# Patient Record
Sex: Female | Born: 1975 | Hispanic: Yes | Marital: Married | Smoking: Never smoker
Health system: Southern US, Community
[De-identification: ages and names within clinical notes are randomized; demographics above are authoritative.]

## PROBLEM LIST (undated history)

## (undated) DIAGNOSIS — E119 Type 2 diabetes mellitus without complications: Secondary | ICD-10-CM

## (undated) DIAGNOSIS — E785 Hyperlipidemia, unspecified: Secondary | ICD-10-CM

## (undated) DIAGNOSIS — I10 Essential (primary) hypertension: Secondary | ICD-10-CM

## (undated) HISTORY — PX: TUBAL LIGATION: SHX77

## (undated) HISTORY — DX: Type 2 diabetes mellitus without complications: E11.9

## (undated) HISTORY — DX: Hyperlipidemia, unspecified: E78.5

## (undated) HISTORY — DX: Essential (primary) hypertension: I10

---

## 2011-01-11 ENCOUNTER — Emergency Department (HOSPITAL_COMMUNITY)
Admission: EM | Admit: 2011-01-11 | Discharge: 2011-01-11 | Disposition: A | Discharge status: Home / Self Care | Payer: Self-pay | Attending: Emergency Medicine | Admitting: Emergency Medicine

## 2011-01-11 ENCOUNTER — Emergency Department (HOSPITAL_COMMUNITY): Payer: Self-pay

## 2011-01-11 DIAGNOSIS — R0602 Shortness of breath: Secondary | ICD-10-CM | POA: Insufficient documentation

## 2011-01-11 DIAGNOSIS — E119 Type 2 diabetes mellitus without complications: Secondary | ICD-10-CM | POA: Insufficient documentation

## 2011-01-11 LAB — CBC
Hemoglobin: 11 g/dL — ABNORMAL LOW (ref 12.0–15.0)
MCH: 25.5 pg — ABNORMAL LOW (ref 26.0–34.0)
MCHC: 33.6 g/dL (ref 30.0–36.0)
MCV: 75.9 fL — ABNORMAL LOW (ref 78.0–100.0)
Platelets: 327 10*3/uL (ref 150–400)
RBC: 4.31 MIL/uL (ref 3.87–5.11)

## 2011-01-11 LAB — URINALYSIS, ROUTINE W REFLEX MICROSCOPIC
Nitrite: NEGATIVE
Protein, ur: NEGATIVE mg/dL
Specific Gravity, Urine: 1.012 (ref 1.005–1.030)
Urobilinogen, UA: 0.2 mg/dL (ref 0.0–1.0)

## 2011-01-11 LAB — URINE MICROSCOPIC-ADD ON

## 2011-01-11 LAB — POCT I-STAT TROPONIN I

## 2011-01-11 LAB — POCT I-STAT, CHEM 8
BUN: 9 mg/dL (ref 6–23)
Calcium, Ion: 1.26 mmol/L (ref 1.12–1.32)
Creatinine, Ser: 0.4 mg/dL — ABNORMAL LOW (ref 0.50–1.10)
Hemoglobin: 12.2 g/dL (ref 12.0–15.0)
TCO2: 21 mmol/L (ref 0–100)

## 2011-01-11 LAB — DIFFERENTIAL
Basophils Relative: 0 % (ref 0–1)
Eosinophils Absolute: 0.1 10*3/uL (ref 0.0–0.7)
Lymphs Abs: 2.6 10*3/uL (ref 0.7–4.0)
Monocytes Absolute: 0.6 10*3/uL (ref 0.1–1.0)
Monocytes Relative: 5 % (ref 3–12)
Neutrophils Relative %: 73 % (ref 43–77)

## 2011-01-11 LAB — D-DIMER, QUANTITATIVE: D-Dimer, Quant: 0.53 ug/mL-FEU — ABNORMAL HIGH (ref 0.00–0.48)

## 2011-01-11 LAB — POCT PREGNANCY, URINE: Preg Test, Ur: NEGATIVE

## 2011-01-11 MED ORDER — IOHEXOL 300 MG/ML  SOLN
80.0000 mL | Freq: Once | INTRAMUSCULAR | Status: AC | PRN
  Administered 2011-01-11: 80 mL via INTRAVENOUS

## 2013-04-07 ENCOUNTER — Other Ambulatory Visit: Payer: Self-pay

## 2013-04-07 ENCOUNTER — Ambulatory Visit: Payer: Self-pay | Admitting: Family Medicine

## 2013-04-07 ENCOUNTER — Encounter (HOSPITAL_COMMUNITY): Payer: Self-pay | Admitting: Emergency Medicine

## 2013-04-07 ENCOUNTER — Emergency Department (HOSPITAL_COMMUNITY)
Admission: EM | Admit: 2013-04-07 | Discharge: 2013-04-07 | Disposition: A | Discharge status: Home / Self Care | Payer: Self-pay | Attending: Emergency Medicine | Admitting: Emergency Medicine

## 2013-04-07 ENCOUNTER — Emergency Department (HOSPITAL_COMMUNITY): Payer: Self-pay

## 2013-04-07 VITALS — BP 130/80 | HR 76 | Temp 100.0°F | Resp 16 | Ht 62.0 in | Wt 167.0 lb

## 2013-04-07 DIAGNOSIS — R002 Palpitations: Secondary | ICD-10-CM

## 2013-04-07 DIAGNOSIS — R42 Dizziness and giddiness: Secondary | ICD-10-CM

## 2013-04-07 DIAGNOSIS — R0609 Other forms of dyspnea: Secondary | ICD-10-CM

## 2013-04-07 DIAGNOSIS — R5381 Other malaise: Secondary | ICD-10-CM | POA: Insufficient documentation

## 2013-04-07 DIAGNOSIS — E785 Hyperlipidemia, unspecified: Secondary | ICD-10-CM | POA: Insufficient documentation

## 2013-04-07 DIAGNOSIS — R011 Cardiac murmur, unspecified: Secondary | ICD-10-CM

## 2013-04-07 DIAGNOSIS — R0789 Other chest pain: Secondary | ICD-10-CM | POA: Insufficient documentation

## 2013-04-07 DIAGNOSIS — R059 Cough, unspecified: Secondary | ICD-10-CM | POA: Insufficient documentation

## 2013-04-07 DIAGNOSIS — I10 Essential (primary) hypertension: Secondary | ICD-10-CM | POA: Insufficient documentation

## 2013-04-07 DIAGNOSIS — R509 Fever, unspecified: Secondary | ICD-10-CM

## 2013-04-07 DIAGNOSIS — R0600 Dyspnea, unspecified: Secondary | ICD-10-CM

## 2013-04-07 DIAGNOSIS — R0602 Shortness of breath: Secondary | ICD-10-CM | POA: Insufficient documentation

## 2013-04-07 DIAGNOSIS — E119 Type 2 diabetes mellitus without complications: Secondary | ICD-10-CM

## 2013-04-07 DIAGNOSIS — R51 Headache: Secondary | ICD-10-CM | POA: Insufficient documentation

## 2013-04-07 DIAGNOSIS — Z79899 Other long term (current) drug therapy: Secondary | ICD-10-CM | POA: Insufficient documentation

## 2013-04-07 DIAGNOSIS — R05 Cough: Secondary | ICD-10-CM

## 2013-04-07 DIAGNOSIS — R079 Chest pain, unspecified: Secondary | ICD-10-CM

## 2013-04-07 LAB — POCT CBC
HCT, POC: 32.8 % — AB (ref 37.7–47.9)
Lymph, poc: 2.5 (ref 0.6–3.4)
MCH, POC: 25.6 pg — AB (ref 27–31.2)
MCHC: 31.1 g/dL — AB (ref 31.8–35.4)
MCV: 82.1 fL (ref 80–97)
MID (cbc): 0.4 (ref 0–0.9)
POC LYMPH PERCENT: 28.2 %L (ref 10–50)
RDW, POC: 14.5 %
WBC: 8.7 10*3/uL (ref 4.6–10.2)

## 2013-04-07 LAB — POCT INFLUENZA A/B: Influenza B, POC: NEGATIVE

## 2013-04-07 LAB — BASIC METABOLIC PANEL
BUN: 4 mg/dL — ABNORMAL LOW (ref 6–23)
CO2: 25 mEq/L (ref 19–32)
Chloride: 94 mEq/L — ABNORMAL LOW (ref 96–112)
Creatinine, Ser: 0.4 mg/dL — ABNORMAL LOW (ref 0.50–1.10)
Glucose, Bld: 142 mg/dL — ABNORMAL HIGH (ref 70–99)

## 2013-04-07 LAB — POCT I-STAT TROPONIN I: Troponin i, poc: 0 ng/mL (ref 0.00–0.08)

## 2013-04-07 NOTE — Progress Notes (Addendum)
This chart was scribed for Kristen Staggers, MD by Kristen Rogers, ED Scribe. This patient was seen in room Room/bed info not found and the patient's care was started at 10:01 AM. Subjective:    Patient ID: Kristen Rogers, female    DOB: [birth year], 37 y.o.   MRN: [id number] Chief Complaint  Patient presents with  . Dizziness    this am  . Headache  . Palpitations   HPI Kristen Rogers is a 37 y.o. female with hx of DM, hyperlipidemia, and hypertension who presents to the [facility] complaining of ongoing dizziness, HA, and palpitations with associated nausea and CP that began last night.Pt rates her current pain as 4/10. She states her pain was a 4/10 last night. She states her HA made her feel dizzy. She reports having difficulty breathing that began last night. Pt reports having frontal HA when palpitations began. Pt states she has been having a persistent cough for the past 8 days, minimal dry cough. Pt states her husband was sick last week and tested positive for flu. Pt denies getting the flu this season. Pt denies fever.  Review of Systems  Constitutional: Negative for fever.  Respiratory: Positive for cough.   Cardiovascular: Positive for chest pain and palpitations.  Gastrointestinal: Positive for nausea.  Neurological: Positive for dizziness and headaches.   Objective:   Physical Exam  Vitals reviewed. Constitutional: She is oriented to person, place, and time. She appears well-developed and well-nourished. No distress.  HENT:  Head: Normocephalic and atraumatic.  Right Ear: Hearing, tympanic membrane, external ear and ear canal normal.  Left Ear: Hearing, tympanic membrane, external ear and ear canal normal.  Nose: Nose normal.  Mouth/Throat: Oropharynx is clear and moist. No oropharyngeal exudate.  Eyes: Conjunctivae and EOM are normal. Pupils are equal, round, and reactive to light.  No nystagmus.  Neck:  No lymphadenopathy.  Cardiovascular: Normal rate, regular  rhythm, normal heart sounds and intact distal pulses.   Great 2-6 out of 6 systolic murmur her L sternal border  Pulmonary/Chest: Effort normal and breath sounds normal. No respiratory distress. She has no wheezes. She has no rhonchi.  Unable to reproduce pain with palpitation. Equal breath sounds bilaterally.  Abdominal: Soft. There is no tenderness.  Musculoskeletal:  Calves non-tender and no swelling. No lower extremity swelling.  Lymphadenopathy:    She has no cervical adenopathy.  Neurological: She is alert and oriented to person, place, and time.  Unsteady with Romberg but no fall. Neg pronator drift. Non-focal otherwise.  Skin: Skin is warm and dry. No rash noted.  Psychiatric: She has a normal mood and affect. Her behavior is normal.    Filed Vitals:   04/07/13 0929  BP: 130/80  Pulse: 76  Temp: 100 F (37.8 C)  TempSrc: Oral  Resp: 16  Height: 5\' 2"  (1.575 m)  Weight: 167 lb (75.751 kg)  SpO2: 98%   Results for orders placed in visit on 04/07/13  POCT CBC      Result Value Range   WBC 8.7  4.6 - 10.2 K/uL   Lymph, poc 2.5  0.6 - 3.4   POC LYMPH PERCENT 28.2  10 - 50 %L   MID (cbc) 0.4  0 - 0.9   POC MID % 5.1  0 - 12 %M   POC Granulocyte 5.8  2 - 6.9   Granulocyte percent 66.7  37 - 80 %G   RBC 3.99 (*) 4.04 - 5.48 M/uL   Hemoglobin 10.2 (*) 12.2 - 16.2  g/dL   HCT, POC 65.7 (*) 84.6 - 47.9 %   MCV 82.1  80 - 97 fL   MCH, POC 25.6 (*) 27 - 31.2 pg   MCHC 31.1 (*) 31.8 - 35.4 g/dL   RDW, POC 96.2     Platelet Count, POC 311  142 - 424 K/uL   MPV 9.7  0 - 99.8 fL  POCT INFLUENZA A/B      Result Value Range   Influenza A, POC Negative     Influenza B, POC Negative     EKG: SR, no acute findings.   10:49 AM EMS called for transport, placed in room and on monitor - sinus rhythm. 10:52 AM 20 gauge IV, L ac, NS.  Advised charge nurse at ER.   10:56 AM Transfer of care to EMS after report.   Assessment & Plan:  Kristen Rogers is a 37 y.o.  female Palpitations - Plan: EKG 12-Lead  Fever - Plan: POCT CBC, POCT Influenza A/B  Chest pain  Dizziness - Plan: POCT CBC, POCT Influenza A/B  Dyspnea  Diabetes  Heart murmur  Hx of Dm2, hyperlipidemia. Initial cough for about a week, but now with acute onset of L chest pain, palpitations, associated HA, dizziness and nausea since last night.  Flu testing and blood counts as above WNL. Will have evaluated at [facility] for chest pain as cardiac RF's of DM and hyperlipidemia, and possible new onset murmur. Charge nurse advised, sent to ER by EMS.   Patient Instructions   Further evaluation at [facility]:  Driving directions to [facility] 3D2D  [phone]  - more info    [address]  [place], [place] [address]     1. Head south on [address] toward [address]      0.5 mi    2. Sharp left onto [address]      0.6 mi    3. Turn left onto the [address] ramp      0.2 mi    4. Merge onto [address]      3.0 mi    5. Continue straight to stay on [address]      0.4 mi    6. Slight left to stay on [address] E      1.2 mi    7. Turn right onto [address]      0.1 mi    8. Turn left onto [address]      361 ft    9. Take the 1st left onto [facility]  Destination will be on the right

## 2013-04-07 NOTE — ED Notes (Signed)
MD at bedside assessing patient via interpreter phone.

## 2013-04-07 NOTE — ED Provider Notes (Signed)
CSN: [id number]     Arrival date & time 04/07/13  1141 History   First MD Initiated Contact with Patient 04/07/13 1226     Chief Complaint  Patient presents with  . Chest Pain  . Headache  . Cough   (Consider location/radiation/quality/duration/timing/severity/associated sxs/prior Treatment) HPI Ms. Kristen Rogers is a 37 y.o. spanish-speaking female w/ PMHx of HTN, HLD, DM type II, presents to the ED w/ complaints of recent cough, SOB, sharp left-sided chest pain, and headache for the past week. The patient was sent from urgent care for further workup of URI symptoms. She claims she has had recent SOB w/ an accompanying cough, not productive of sputum, as well as a sharp chest pain, worse with inspiration and mildly reproducible. No radiation, no associated nausea, vomiting, diaphoresis. She claims to have had a subjective fever at home. She also claims her husband has had URI symptoms recently as well. The patient also describes a global headache, dull in nature, not associated w/ photophobia, nausea, vomiting, change in vision, or retro-orbital pain.   Past Medical History  Diagnosis Date  . Hypertension   . Hyperlipidemia   . Diabetes mellitus without complication    History reviewed. No pertinent past surgical history. History reviewed. No pertinent family history. History  Substance Use Topics  . Smoking status: Never Smoker   . Smokeless tobacco: Not on file  . Alcohol Use: No   OB History   Grav Para Term Preterm Abortions TAB SAB Ect Mult Living                 Review of Systems General: Positive for subjective fever and fatigue. Denies diaphoresis, appetite change.  Respiratory: Positive for SOB and cough. Denies DOE, chest tightness, and wheezing.   Cardiovascular: Positive for chest pain, palpitations. Denies leg swelling.  Gastrointestinal: Denies nausea, vomiting, abdominal pain, diarrhea, constipation, blood in stool and abdominal distention.  Genitourinary:  Denies dysuria, urgency, frequency, hematuria, flank pain and difficulty urinating.  Endocrine: Denies hot or cold intolerance, sweats, polyuria, polydipsia. Musculoskeletal: Denies myalgias, back pain, joint swelling, arthralgias and gait problem.  Skin: Denies pallor, rash and wounds.  Neurological: Positive for headaches. Denies dizziness, seizures, syncope, weakness, lightheadedness, numbness.  Psychiatric/Behavioral: Denies mood changes, confusion, nervousness, sleep disturbance and agitation.  Allergies  Review of patient's allergies indicates no known allergies.  Home Medications   Current Outpatient Rx  Name  Route  Sig  Dispense  Refill  . FLUoxetine (PROZAC) 10 MG capsule   Oral   Take 10 mg by mouth at bedtime.          Marland Kitchen glimepiride (AMARYL) 2 MG tablet   Oral   Take 2 mg by mouth daily with breakfast.         . lisinopril (PRINIVIL,ZESTRIL) 5 MG tablet   Oral   Take 5 mg by mouth daily.         Marland Kitchen lovastatin (MEVACOR) 20 MG tablet   Oral   Take 20 mg by mouth at bedtime.         . metformin (FORTAMET) 1000 MG (OSM) 24 hr tablet   Oral   Take 1,000 mg by mouth daily with breakfast.         . Omega-3 Fatty Acids (FISH OIL TRIPLE STRENGTH PO)   Oral   Take 1,500 mg by mouth daily.          Physical Exam Filed Vitals:   04/07/13 1145 04/07/13 1200 04/07/13 1215 04/07/13 1230  BP: 103/70 102/65  111/70  Pulse: 70 69 69 67  Temp:      TempSrc:      Resp: 17 17 16 16   Weight:      SpO2: 100% 100% 100% 100%  General: Vital signs reviewed.  Patient is a well-developed and well-nourished, in no acute distress and cooperative with exam. Alert and oriented x3.  Head: Normocephalic and atraumatic. Eyes: PERRL, EOMI, conjunctivae normal, No scleral icterus.  Neck: Supple, trachea midline, normal ROM, No JVD, masses, thyromegaly, or carotid bruit present.  Cardiovascular: RRR, S1 normal, S2 normal, no murmurs, gallops, or rubs. Pulmonary/Chest: Normal  respiratory effort, CTAB, no wheezes, rales, or rhonchi. Abdominal: Soft, non-tender, non-distended, bowel sounds are normal, no masses, organomegaly, or guarding present.  Musculoskeletal: No joint deformities, erythema, or stiffness, ROM full and no nontender. Extremities: No swelling or edema,  pulses symmetric and intact bilaterally. No cyanosis or clubbing. Neurological: A&O x3, Strength is normal and symmetric bilaterally, cranial nerve II-XII are grossly intact, no focal motor deficit, sensory intact to light touch bilaterally.  Skin: Warm, dry and intact. No rashes or erythema. Psychiatric: Normal mood and affect. speech and behavior is normal. Cognition and memory are normal.   ED Course  Procedures (including critical care time) Labs Review Labs Reviewed  BASIC METABOLIC PANEL   Imaging Review No results found.  EKG Interpretation   None       MDM   Ms. Kristen Rogers is a 37 y.o. spanish-speaking female w/ PMHx of HTN, HLD, DM type II, presents to the ED w/ complaints of recent cough, SOB, sharp left-sided chest pain, and headache for the past week. Given clinical findings and history, most likely w/ viral URI at this time. Seen in urgent care, influenza -ve. No leukocytosis. EKG wnl. -CXR shows no edema or consolidation. -BMET w/ mild hyponatremia (130), otherwise wnl. -poc troponin -ve  Patient most likely w/ viral URI. Will advise patient to follow up with PCP/urgent care in 1-2 weeks, or if symptoms get worse. Patient okay for discharge.   Courtney Paris, MD 04/07/13 [phone]

## 2013-04-07 NOTE — ED Notes (Signed)
PT ambulated with baseline gait; VSS; A&Ox3; no signs of distress; respirations even and unlabored; skin warm and dry; no questions upon discharge.  

## 2013-04-07 NOTE — ED Provider Notes (Addendum)
I saw and evaluated the patient, reviewed the resident's note and I agree with the findings and plan.  EKG Interpretation   None     EKG in MUSE  Pt with viral illness, no concern for ACS, PE or other acute cardiopulm process.   Tyshaun Vinzant B. Bernette Mayers, MD 04/07/13 1823  Bonnita Levan. Bernette Mayers, MD 04/07/13 1610

## 2013-04-07 NOTE — ED Notes (Signed)
Pt in via EMS from urgent care, went there for evaluation for cough x2-3 days with chest pain, also headache, chest pain is worse with inspiration, negative chest xray, patient denies chest pain upon arrival to ED, c/o headache, sent here for further evaluation. IV started at urgent care.

## 2013-04-07 NOTE — Patient Instructions (Addendum)
Further evaluation at [facility]:  Driving directions to [facility] 3D2D  [phone]  - more info    [address]  [place], [place] [address]     1. Head south on [address] toward [address]      0.5 mi    2. Sharp left onto [address]      0.6 mi    3. Turn left onto the [address] ramp      0.2 mi    4. Merge onto [address]      3.0 mi    5. Continue straight to stay on [address] E      0.4 mi    6. Slight left to stay on [address] E      1.2 mi    7. Turn right onto [address]      0.1 mi    8. Turn left onto [address]      361 ft    9. Take the 1st left onto [facility]  Destination will be on the right

## 2013-04-07 NOTE — ED Notes (Signed)
Pt returned from xray

## 2014-05-11 IMAGING — CR DG CHEST 2V
2 series · 2 of 2 positions shown · non-contrast
Comparison: Chest radiograph January 11, 2011 and chest CT
January 11, 2011

CLINICAL DATA: Chest pain and cough

EXAM:
CHEST  2 VIEW

[w chest pa]
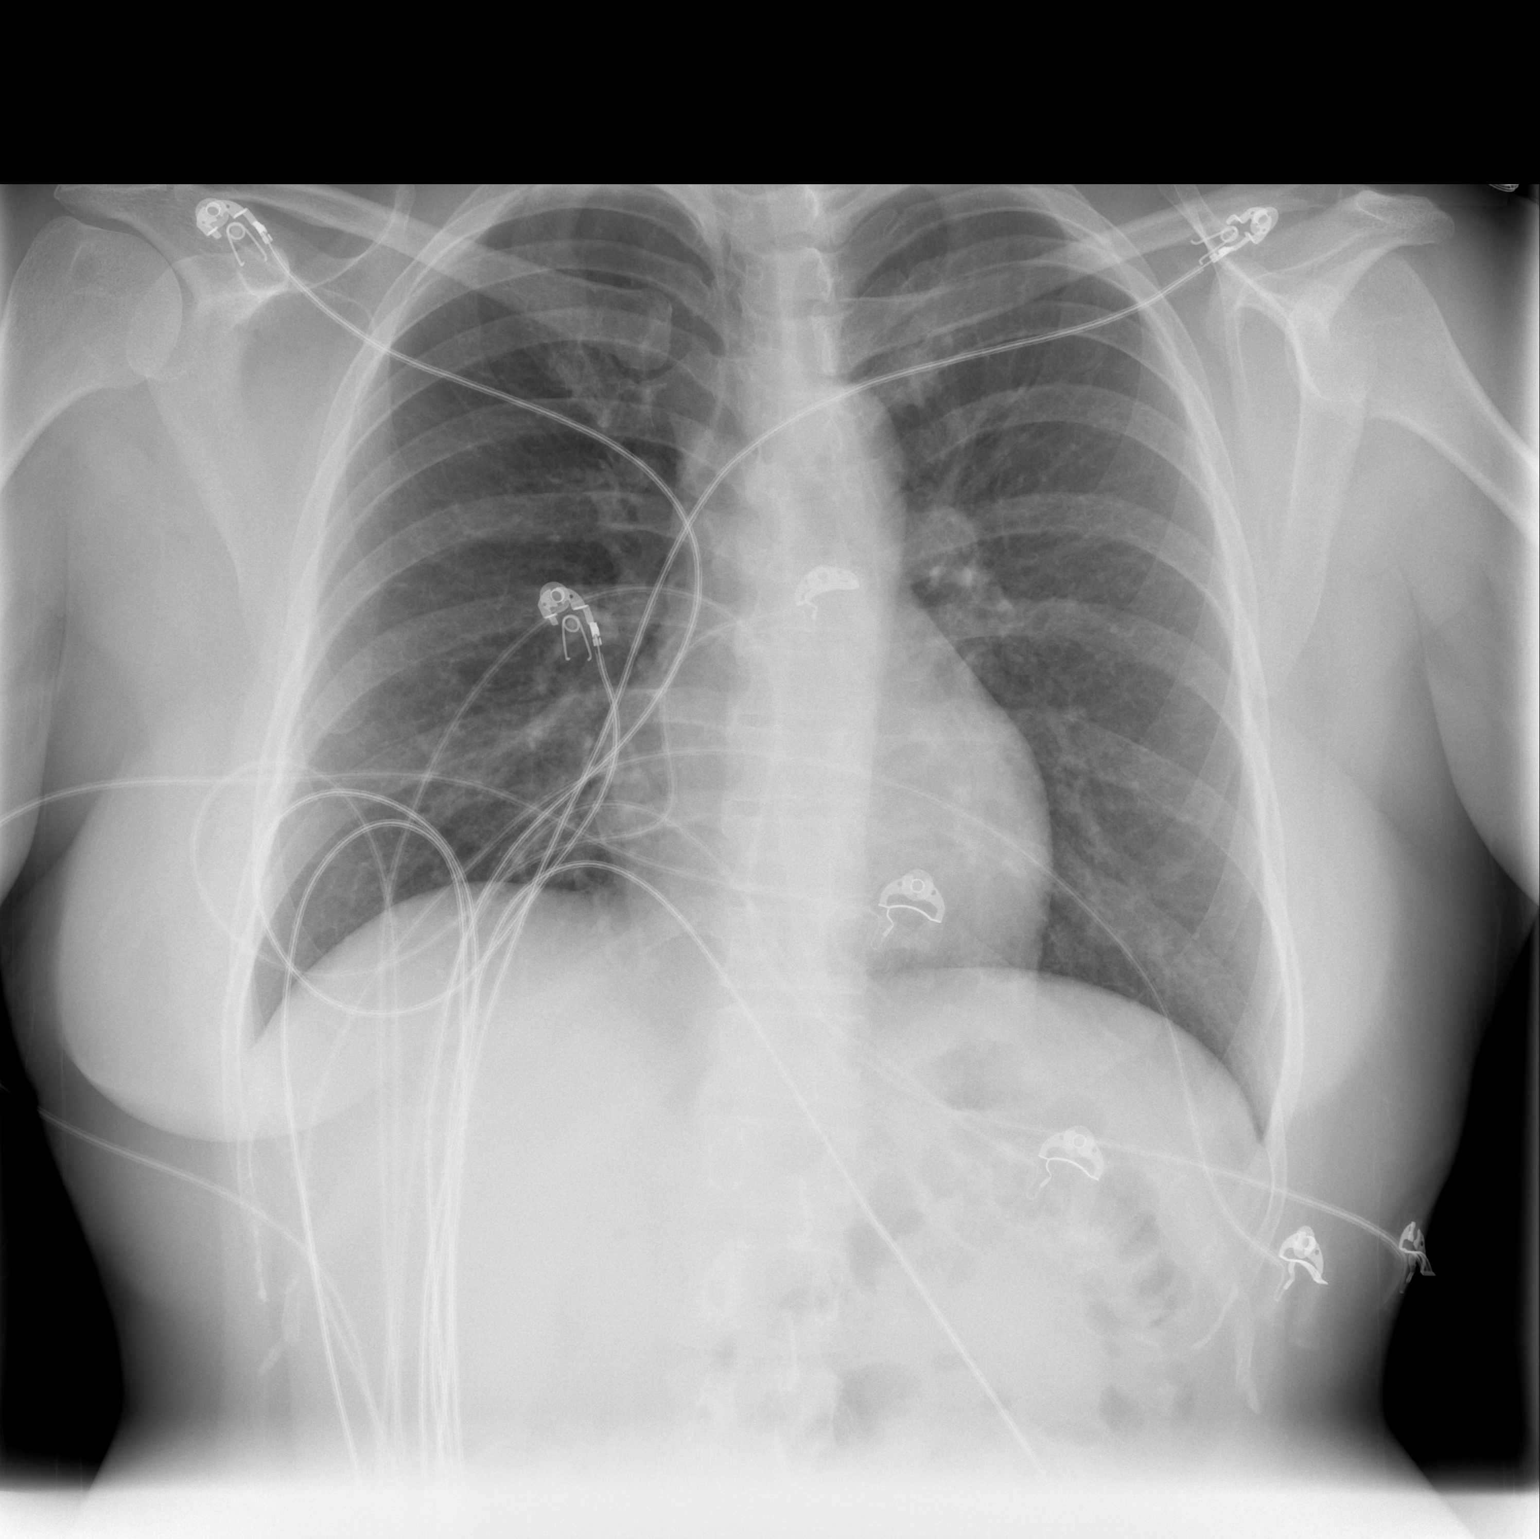

[w chest lat]
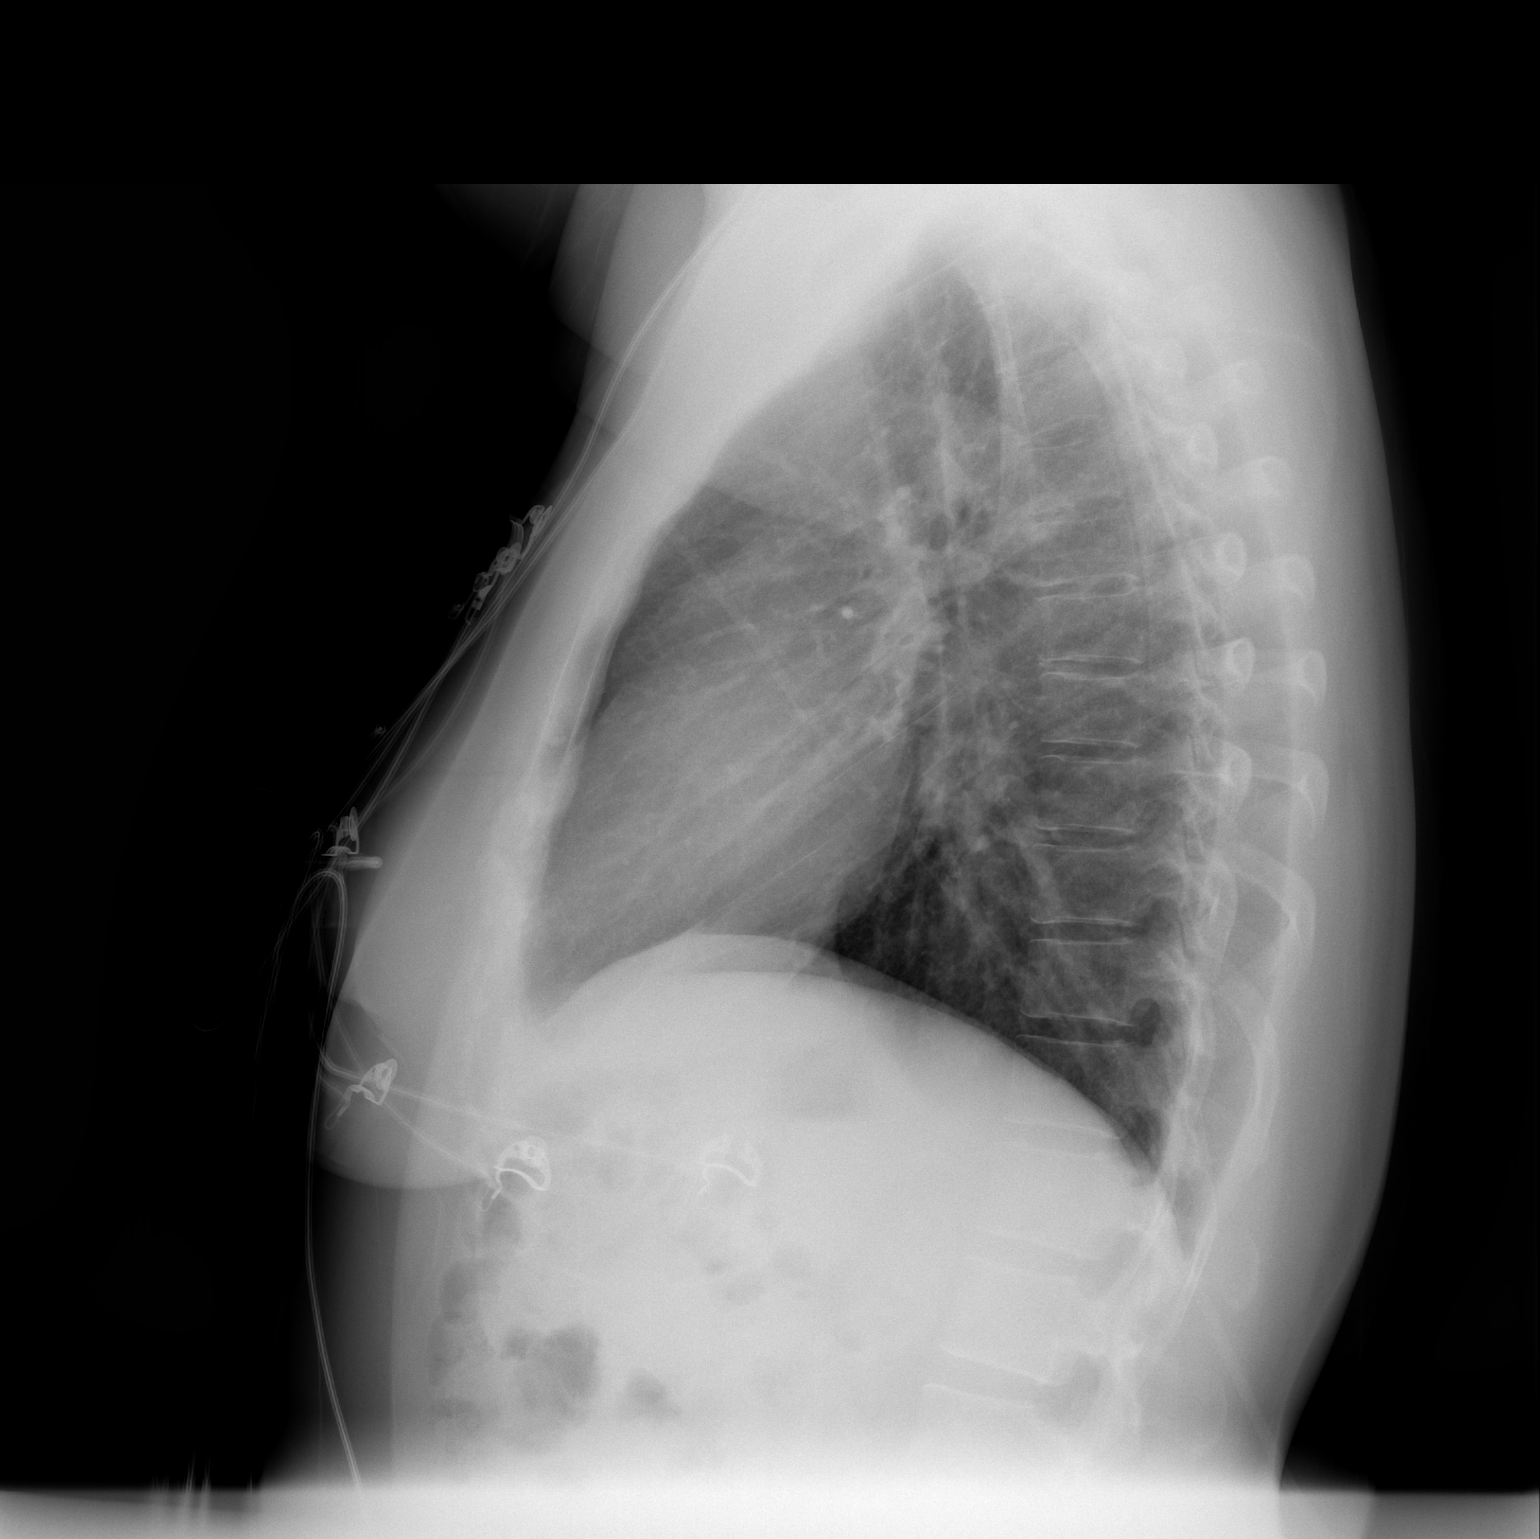

[2 of 2 positions shown; findings below may reference images not displayed]

FINDINGS: The lungs are clear. The heart size and pulmonary vascularity are
normal. No adenopathy. There is thoracic levo rotoscoliosis. No
pneumothorax.
IMPRESSION: No edema or consolidation.

## 2014-08-24 ENCOUNTER — Ambulatory Visit (INDEPENDENT_AMBULATORY_CARE_PROVIDER_SITE_OTHER): Payer: Self-pay | Admitting: Emergency Medicine

## 2014-08-24 VITALS — BP 118/68 | HR 68 | Temp 97.8°F | Resp 16 | Ht 62.25 in | Wt 155.8 lb

## 2014-08-24 DIAGNOSIS — E119 Type 2 diabetes mellitus without complications: Secondary | ICD-10-CM

## 2014-08-24 DIAGNOSIS — I10 Essential (primary) hypertension: Secondary | ICD-10-CM

## 2014-08-24 DIAGNOSIS — R079 Chest pain, unspecified: Secondary | ICD-10-CM

## 2014-08-24 NOTE — Addendum Note (Signed)
Addended byAlden Benjamin: Jedi Catalfamo R on: 08/24/2014 06:42 PM   Modules accepted: Orders

## 2014-08-24 NOTE — Addendum Note (Signed)
Addended by: Carmelina DaneANDERSON, Darin Redmann S on: 08/24/2014 06:40 PM   Modules accepted: Orders

## 2014-08-24 NOTE — Progress Notes (Signed)
[facility] [address], [place] [id number] [phone]  Date:  08/24/2014   Name:  Kristen CastillaFelicita Rogers   DOB:  07/27/1975   MRN:  [id number]  PCP:  No PCP Per Patient    Chief Complaint: blood pressure issues and chest wall pain   History of Present Illness:  [name] is a 39 y.o. very pleasant female patient who presents with the following:  Patient has chest pain intermittently for months.  Lasts 5 minutes or so. Non radiating Sometimes associated with shortness of breath and light headedness No rapid or irregular heart beat No nausea or diaphoresis. No nausea or vomiting. Not associated with activity occurs while up and around or laying down. Has had numerous office visits for similar but always non diagnostic cardiogram Has DM2 and HBP under treatment. Last seen by FMD 5 months ago. No improvement with over the counter medications or other home remedies.  Denies other complaint or health concern today.   There are no active problems to display for this patient.   Past Medical History  Diagnosis Date  . Hypertension   . Hyperlipidemia   . Diabetes mellitus without complication     History reviewed. No pertinent past surgical history.  History  Substance Use Topics  . Smoking status: Never Smoker   . Smokeless tobacco: Not on file  . Alcohol Use: No    History reviewed. No pertinent family history.  No Known Allergies  Medication list has been reviewed and updated.  Current Outpatient Prescriptions on File Prior to Visit  Medication Sig Dispense Refill  . FLUoxetine (PROZAC) 10 MG capsule Take 10 mg by mouth at bedtime.     Marland Kitchen. glimepiride (AMARYL) 2 MG tablet Take 2 mg by mouth daily with breakfast.    . lisinopril (PRINIVIL,ZESTRIL) 5 MG tablet Take 5 mg by mouth daily.    Marland Kitchen. lovastatin (MEVACOR) 20 MG tablet Take 20 mg by mouth at bedtime.    . metformin (FORTAMET) 1000 MG (OSM) 24 hr tablet Take 1,000 mg by mouth daily with  breakfast.    . Omega-3 Fatty Acids (FISH OIL TRIPLE STRENGTH PO) Take 1,500 mg by mouth daily.     No current facility-administered medications on file prior to visit.    Review of Systems:  Review of Systems  Constitutional: Negative for fever, chills and fatigue.  HENT: Negative for congestion, ear pain, hearing loss, postnasal drip, rhinorrhea and sinus pressure.   Eyes: Negative for discharge and redness.  Respiratory: Negative for cough, shortness of breath and wheezing.   Cardiovascular: Negative for chest pain and leg swelling.  Gastrointestinal: Negative for nausea, vomiting, abdominal pain, constipation and blood in stool.  Genitourinary: Negative for dysuria, urgency and frequency.  Musculoskeletal: Negative for neck stiffness.  Skin: Negative for rash.  Neurological: Negative for seizures, weakness and headaches.   Physical Examination: Filed Vitals:   08/24/14 1802  BP: 118/68  Pulse: 68  Temp: 97.8 F (36.6 C)  Resp: 16   Filed Vitals:   08/24/14 1802  Height: 5' 2.25" (1.581 m)  Weight: 155 lb 12.8 oz (70.67 kg)   Body mass index is 28.27 kg/(m^2). Ideal Body Weight: Weight in (lb) to have BMI = 25: 137.5  GEN: WDWN, NAD, Non-toxic, A & O x 3 HEENT: Atraumatic, Normocephalic. Neck supple. No masses, No LAD. Ears and Nose: No external deformity. CV: RRR, No M/G/R. No JVD. No thrill. No extra heart sounds. PULM: CTA B, no wheezes,  crackles, rhonchi. No retractions. No resp. distress. No accessory muscle use. ABD: S, NT, ND, +BS. No rebound. No HSM. EXTR: No c/c/e NEURO Normal gait.  PSYCH: Normally interactive. Conversant. Not depressed or anxious appearing.  Calm demeanor.    Assessment and Plan: 1. Chest pain, unspecified chest pain type Normal EKG  Will refer cardiology to nail down etiology  2. Type 2 diabetes mellitus without complication Under treatment and suitable control by FMD  3. Essential hypertension, benign Under treatment with  medications  Follow up with FMD Discussed Red Flags and appropriate actions with patient   Signed Phillips OdorJeffery Anderson, MD

## 2014-08-24 NOTE — Patient Instructions (Signed)
Dolor de pecho (no específico) °(Chest Pain (Nonspecific)) °Con frecuencia es difícil dar un diagnóstico específico de la causa del dolor de pecho. Siempre hay una posibilidad de que el dolor podría estar relacionado con algo grave, como un ataque al corazón o un coágulo sanguíneo en los pulmones. Debe someterse a controles con el médico para más evaluaciones. °CAUSAS  °· Acidez. °· Neumonía o bronquitis. °· Ansiedad o estrés. °· Inflamación de la zona que rodea al corazón (pericarditis) o a los pulmones (pleuritis o pleuresía). °· Un coágulo sanguíneo en el pulmón. °· Colapso de un pulmón (neumotórax), que puede aparecer de manera repentina por sí solo (neumotórax espontáneo) o debido a un traumatismo en el tórax. °· Culebrilla (virus del herpes zóster). °La pared torácica está compuesta por huesos, músculos y cartílago. Cualquiera de estos puede ser la fuente del dolor. °· Puede haber una contusión en los huesos debido a una lesión. °· Puede haber un esguince en los músculos o el cartílago ocasionado por la tos o por trabajo excesivo. °· El cartílago puede verse afectado por una inflamación y provocar dolor (costocondritis). °DIAGNÓSTICO  °Quizás se necesiten análisis de laboratorio u otros estudios para encontrar la causa del dolor. Además, puede indicarle que se haga una prueba llamada electrocadiograma (ECG) ambulatorio. El ECG registra los patrones de los latidos cardíacos durante 24 horas. Además, pueden hacerle otros estudios, por ejemplo: °· Ecocardiograma transtorácico (ETT). Durante el ecocardiograma, se usan ondas sonoras para evaluar el flujo de la sangre a través del corazón. °· Ecocardiograma transesofágico (ETE). °· Monitoreo cardíaco. Permite que el médico controle la frecuencia y el ritmo cardíaco en tiempo real. °· Monitor Holter. Es un dispositivo portátil que registra los latidos cardíacos y ayuda a diagnosticar las arritmias cardíacas. Le permite al médico registrar la actividad cardíaca  durante varios días, si es necesario. °· Pruebas de estrés por ejercicio o por medicamentos que aceleran los latidos cardíacos. °TRATAMIENTO  °· El tratamiento depende de la causa del dolor de pecho. El tratamiento puede incluir: °¨ Inhibidores de la acidez estomacal. °¨ Antiinflamatorios. °¨ Analgésicos para las enfermedades inflamatorias. °¨ Antibióticos, si hay una infección. °· Podrán aconsejarle que modifique su estilo de vida. Esto incluye dejar de fumar y evitar el alcohol, la cafeína y el chocolate. °· Pueden aconsejarle que mantenga la cabeza levantada (elevada) cuando duerme. Esto reduce la probabilidad de que el ácido retroceda del estómago al esófago. °En la mayoría de los casos, el dolor de pecho no específico mejorará en el término de 2 a 3 días, con reposo y analgésicos suaves.  °INSTRUCCIONES PARA EL CUIDADO EN EL HOGAR  °· Si le prescriben antibióticos, tómelos tal como se le indicó. Termínelos aunque comience a sentirse mejor. °· Durante los días siguientes, no haga actividades físicas que provoquen dolor de pecho. Continúe con las actividades físicas tal como se le indicó °· No consuma ningún producto que contenga tabaco, incluidos cigarrillos, tabaco de mascar o cigarrillos electrónicos. °· Evite el consumo de alcohol. °· Tome los medicamentos solamente como se lo haya indicado el médico. °· Siga las sugerencias del médico en lo que respecta a las pruebas adicionales, si el dolor de pecho no desaparece. °· Concurra a todas las visitas de control programadas. Si no lo hace, podría desarrollar problemas permanentes (crónicos) relacionados con el dolor. Si hay algún problema para concurrir a una cita, llame para reprogramarla. °SOLICITE ATENCIÓN MÉDICA SI:  °· El dolor de pecho no desaparece, incluso después del tratamiento. °· Tiene una erupción cutánea con ampollas en el   pecho. °· Tiene fiebre. °SOLICITE ATENCIÓN MÉDICA DE INMEDIATO SI:  °· Aumenta el dolor de pecho o este se irradia hacia el  brazo, el cuello, la mandíbula, la espalda o el abdomen. °· Le falta el aire. °· La tos empeora, o expectora sangre. °· Siente dolor intenso en la espalda o el abdomen. °· Se siente nauseoso o vomita. °· Siente debilidad intensa. °· Se desmaya. °· Tiene escalofríos. °Esto es una emergencia. No espere a ver si el dolor se pasa. Obtenga ayuda médica de inmediato. Llame a los servicios de emergencia locales (911 en los [place]). No conduzca por sus propios medios hasta el hospital. °ASEGÚRESE DE QUE:  °· Comprende estas instrucciones. °· Controlará su afección. °· Recibirá ayuda de inmediato si no mejora o si empeora. °Document Released: 04/06/2005 Document Revised: 04/11/2013 °Exit[employer]® Patient Information ©2015 [employer]. This information is not intended to replace advice given to you by your health care provider. Make sure you discuss any questions you have with your health care provider. ° °

## 2014-10-08 ENCOUNTER — Ambulatory Visit: Payer: Self-pay | Admitting: Cardiovascular Disease

## 2014-12-20 ENCOUNTER — Emergency Department (HOSPITAL_COMMUNITY): Payer: Self-pay

## 2014-12-20 ENCOUNTER — Encounter (HOSPITAL_COMMUNITY): Payer: Self-pay | Admitting: Emergency Medicine

## 2014-12-20 ENCOUNTER — Emergency Department (HOSPITAL_COMMUNITY)
Admission: EM | Admit: 2014-12-20 | Discharge: 2014-12-21 | Disposition: A | Discharge status: Home / Self Care | Payer: Self-pay | Attending: Emergency Medicine | Admitting: Emergency Medicine

## 2014-12-20 DIAGNOSIS — Z79899 Other long term (current) drug therapy: Secondary | ICD-10-CM | POA: Insufficient documentation

## 2014-12-20 DIAGNOSIS — E785 Hyperlipidemia, unspecified: Secondary | ICD-10-CM | POA: Insufficient documentation

## 2014-12-20 DIAGNOSIS — R531 Weakness: Secondary | ICD-10-CM | POA: Insufficient documentation

## 2014-12-20 DIAGNOSIS — R0789 Other chest pain: Secondary | ICD-10-CM | POA: Insufficient documentation

## 2014-12-20 DIAGNOSIS — I10 Essential (primary) hypertension: Secondary | ICD-10-CM | POA: Insufficient documentation

## 2014-12-20 DIAGNOSIS — E119 Type 2 diabetes mellitus without complications: Secondary | ICD-10-CM | POA: Insufficient documentation

## 2014-12-20 LAB — BASIC METABOLIC PANEL
Anion gap: 10 (ref 5–15)
BUN: 11 mg/dL (ref 6–20)
CHLORIDE: 96 mmol/L — AB (ref 101–111)
CO2: 25 mmol/L (ref 22–32)
CREATININE: 0.53 mg/dL (ref 0.44–1.00)
Calcium: 9.7 mg/dL (ref 8.9–10.3)
GFR calc Af Amer: >60 mL/min (ref >60)
GFR calc non Af Amer: >60 mL/min (ref >60)
Glucose, Bld: 125 mg/dL — ABNORMAL HIGH (ref 65–99)
Potassium: 4.4 mmol/L (ref 3.5–5.1)
Sodium: 131 mmol/L — ABNORMAL LOW (ref 135–145)

## 2014-12-20 LAB — CBC
HCT: 35.6 % — ABNORMAL LOW (ref 36.0–46.0)
Hemoglobin: 12.3 g/dL (ref 12.0–15.0)
MCH: 30.5 pg (ref 26.0–34.0)
MCHC: 34.6 g/dL (ref 30.0–36.0)
MCV: 88.3 fL (ref 78.0–100.0)
PLATELETS: 303 10*3/uL (ref 150–400)
RBC: 4.03 MIL/uL (ref 3.87–5.11)
RDW: 13.1 % (ref 11.5–15.5)
WBC: 10.6 10*3/uL — ABNORMAL HIGH (ref 4.0–10.5)

## 2014-12-20 LAB — I-STAT TROPONIN, ED: Troponin i, poc: 0 ng/mL (ref 0.00–0.08)

## 2014-12-20 NOTE — ED Provider Notes (Signed)
CSN: [id number]     Arrival date & time 12/20/14  1858 History   First MD Initiated Contact with Patient 12/20/14 1859     Chief Complaint  Patient presents with  . Chest Pain    pain inl/chest x 1 month  . Weakness     (Consider location/radiation/quality/duration/timing/severity/associated sxs/prior Treatment) HPI   SPANISH SPEAKING - phone interpretor used.  Kristen Rogers 39 y.o.female  PCP: PROVIDER NOT IN SYSTEM  Blood pressure 159/78, pulse 82, temperature 98.1 F (36.7 C), temperature source Oral, resp. rate 18, last menstrual period 12/18/2014, SpO2 100 %.  SIGNIFICANT PMH: palpitations, hyperlipidemia, depression and diabetes CHIEF COMPLAINT: palpitations and throat tightness  When: This issue has been chronic for the past few months How: The patient reports that over the past few months she has been having palpitations without chest pain or shortness of breath. She has had her thyroid tested and the first result came back positive for thyroid disease the second one came back negative. She was supposed to see a cardiologist 1 month ago for her palpitations but missed her appointment because she had to work. She decided to come to the ER for evaluation. denies eating or drinking less than normal, too much caffeine or changing her diet. Denies stress . Denies weight loss, diarrhea, hair loss. Denies change in vision. She denies diaphoresis or nausea. Today she had palpitations for one monthShe is not currently having any symptoms. She comes to the ER today because she would like to see a cardiologist to get the Holter monitor she was supposed to get one month ago.   Chronicity: one month Location: N/A Radiation: N/A Alleviating factors: None Worsening factors: None  Treatments tried: None Negative ROS: Confusion, diaphoresis, fever, headache, weakness (general or focal), change of vision,  neck pain, dysphagia, aphagia, chest pain, shortness of breath,  back pain, abdominal  pains, nausea, vomiting, diarrhea, lower extremity swelling, rash.   Past Medical History  Diagnosis Date  . Hypertension   . Hyperlipidemia   . Diabetes mellitus without complication    History reviewed. No pertinent past surgical history. No family history on file. Social History  Substance Use Topics  . Smoking status: Never Smoker   . Smokeless tobacco: None  . Alcohol Use: No   OB History    No data available     Review of Systems  10 Systems reviewed and are negative for acute change except as noted in the HPI.   Allergies  Review of patient's allergies indicates no known allergies.  Home Medications   Prior to Admission medications   Medication Sig Start Date End Date Taking? Authorizing Provider  FLUoxetine (PROZAC) 10 MG capsule Take 10 mg by mouth at bedtime.    Yes Historical Provider, MD  glimepiride (AMARYL) 2 MG tablet Take 2 mg by mouth daily with breakfast.   Yes Historical Provider, MD  lisinopril (PRINIVIL,ZESTRIL) 10 MG tablet Take 10 mg by mouth daily.   Yes Historical Provider, MD  metformin (FORTAMET) 1000 MG (OSM) 24 hr tablet Take 1,000 mg by mouth daily with breakfast.   Yes Historical Provider, MD  sertraline (ZOLOFT) 50 MG tablet Take 50 mg by mouth at bedtime.   Yes Historical Provider, MD  cyclobenzaprine (FLEXERIL) 10 MG tablet Take 0.5-1 tablets (5-10 mg total) by mouth 2 (two) times daily as needed for muscle spasms. 12/21/14   Taji Barretto Neva Seat, PA-C  lovastatin (MEVACOR) 20 MG tablet Take 20 mg by mouth at bedtime.    Historical  Provider, MD  Omega-3 Fatty Acids (FISH OIL TRIPLE STRENGTH PO) Take 1,500 mg by mouth daily.    Historical Provider, MD   BP 116/56 mmHg  Pulse 56  Temp(Src) 97.9 F (36.6 C) (Oral)  Resp 14  SpO2 100%  LMP 12/18/2014 Physical Exam  Constitutional: She appears well-developed and well-nourished. No distress.  HENT:  Head: Normocephalic and atraumatic.  Eyes: Pupils are equal, round, and reactive to light.   Neck: Normal range of motion. Neck supple. No thyroid mass and no thyromegaly present.  Cardiovascular: Normal rate and regular rhythm.   Pulmonary/Chest: Effort normal.  Abdominal: Soft. Bowel sounds are normal. She exhibits no distension. There is no tenderness. There is no rigidity.  Musculoskeletal:  No lower extremity swelling.  Neurological: She is alert.  Skin: Skin is warm and dry. She is not diaphoretic.  Nursing note and vitals reviewed.   ED Course  Procedures (including critical care time) Labs Review Labs Reviewed  BASIC METABOLIC PANEL - Abnormal; Notable for the following:    Sodium 131 (*)    Chloride 96 (*)    Glucose, Bld 125 (*)    All other components within normal limits  CBC - Abnormal; Notable for the following:    WBC 10.6 (*)    HCT 35.6 (*)    All other components within normal limits  I-STAT TROPOININ, ED  Rosezena Sensor, ED    Imaging Review Dg Chest 2 View  12/20/2014   CLINICAL DATA:  Intermittent chest pain for 1 month. Initial encounter.  EXAM: CHEST  2 VIEW  COMPARISON:  PA and lateral chest 04/07/2013.  CT chest 01/11/2011.  FINDINGS: Heart size and mediastinal contours are within normal limits. Both lungs are clear. Visualized skeletal structures are unremarkable.  IMPRESSION: Negative exam.   Electronically Signed   By: Drusilla Kanner M.D.   On: 12/20/2014 19:52   I have personally reviewed and evaluated these images and lab results as part of my medical decision-making.   EKG Interpretation None      MDM   Final diagnoses:  Chest wall pain   Troponin x 2 are negative, BMP and CBC and chest xray are all normal.  Heart Score of 3, recheck Trop at midnight Per Dr. Denny Peon, Dalene Seltzer. The patient has remained chest pain free in the ED and describes a swelling pain that is worse with movement and palpitation.  Doubt cardiac etiology. Referral to cardiology given. Rx: muscle relaxer's for chest wall pain.  Medications - No data to  display  39 y.o.Kristen Rogers's evaluation in the Emergency Department is complete. It has been determined that no acute conditions requiring further emergency intervention are present at this time. The patient/guardian have been advised of the diagnosis and plan. We have discussed signs and symptoms that warrant return to the ED, such as changes or worsening in symptoms.  Vital signs are stable at discharge. Filed Vitals:   12/21/14 0025  BP: 116/56  Pulse: 56  Temp: 97.9 F (36.6 C)  Resp: 14    Patient/guardian has voiced understanding and agreed to follow-up with the PCP or specialist.     Marlon Pel, PA-C 12/21/14 0454  Alvira Monday, MD 12/21/14 [phone]

## 2014-12-20 NOTE — Progress Notes (Signed)
Patient listed as not having insurance or a pcp.  Patient is spanish speaking but but patient's son at bedside speaks some [place].  Patient confirms she has a pcp located at [facility] in [place].  This is [facility] [phone].  System updated.

## 2014-12-20 NOTE — ED Notes (Signed)
Patient states she has chest pain for a month ago but the pain will go away and come back.  No n/v.

## 2014-12-20 NOTE — ED Notes (Signed)
Delay in lab draw, PA examaning pt 

## 2014-12-20 NOTE — ED Notes (Signed)
Patient transported to X-ray 

## 2014-12-21 LAB — I-STAT TROPONIN, ED: Troponin i, poc: 0 ng/mL (ref 0.00–0.08)

## 2014-12-21 MED ORDER — CYCLOBENZAPRINE HCL 10 MG PO TABS: 5.0000 mg | ORAL_TABLET | Freq: Two times a day (BID) | ORAL | Status: DC | PRN

## 2014-12-21 NOTE — Discharge Instructions (Signed)
Dolor de la pared torácica °(Chest Wall Pain) °Dolor en la pared torácica es dolor en o alrededor de los huesos y músculos de su pecho. Podrán pasar hasta 6 semanas hasta que comience a mejorar. Puede demorar más tiempo si es físicamente activo en su trabajo y actividades.  °CAUSAS  °El dolor en el pecho puede aparecer sin motivo. No obstante, algunas causas pueden ser:  °· Una enfermedad viral como la gripe. °· Traumatismos. °· Tos. °· La práctica de ejercicios. °· Artritis. °· Fibromialgia °· Culebrilla. °INSTRUCCIONES PARA EL CUIDADO DOMICILIARIO °· Evite hacer actividad física extenuante. Trate de no esforzarse o realizar actividades que le causen dolor. Aquí se incluyen las actividades en las que usa los músculos del tórax, los abdominales y los músculos laterales, especialmente si debe levantar objetos pesados. °· Aplique hielo sobre la zona dolorida. °¨ Ponga el hielo en una bolsa plástica. °¨ Colóquese una toalla entre la piel y la bolsa de hielo. °¨ Deje la bolsa de hielo durante 15 a 20 minutos por hora, durante los primeros 2 días. °· Utilice los medicamentos de venta libre o de prescripción para el dolor, el malestar o la fiebre, según se lo indique el profesional que lo asiste. °SOLICITE ATENCIÓN MÉDICA DE INMEDIATO SI: °· El dolor aumenta o siente muchas molestias. °· Tiene fiebre. °· El dolor de pecho empeora. °· Desarrolla nuevos e inexplicables síntomas. °· Tiene náuseas o vómitos. °· Transpira o se siente mareado. °· Tiene tos con flema (esputo), o tose con sangre. °ESTÉ SEGURO QUE:  °· Comprende las instrucciones para el alta médica. °· Controlará su enfermedad. °· Solicitará atención médica de inmediato según las indicaciones. °Document Released: 05/18/2006 Document Revised: 06/29/2011 °ExitCare® Patient Information ©2015 [employer]. This information is not intended to replace advice given to you by your health care provider. Make sure you discuss any questions you have with your health care  provider. ° °

## 2015-01-06 NOTE — Progress Notes (Signed)
Cardiology Office Note   Date:  01/07/2015   ID:  Kristen Rogers, DOB 27-Dec-1975, MRN [id number]  PCP:  PROVIDER NOT IN SYSTEM  Cardiologist:   Madilyn Hook, MD   Chief Complaint  Patient presents with  . New Evaluation    chest pain x 4 months - "pulsing and fatigue"  . Dizziness  . Leg Swelling    bilateral  . Shortness of Breath    with exertion and with rest      History of Present Illness: Kristen Rogers is a 39 y.o. femalewith hypertension and diabetes who presents for follow up after being evaluated in the ED for palpitations.   She reports 4 months of intermittent left-sided sharp chest pain and palpitations. These episodes occur sporadically up to 1-1/2 months apart. They last for 15-20 minutes and are associated with shortness of breath, lightheadedness, mild nausea, and diaphoresis. It improves with laying down and gets worse when she exerts herself. They do not typically occur during times of exertion. She denies any family history of cardiac disease or sudden cardiac death.  Kristen Rogers was seen in the ED on 9/1 with a complaint of palpitations.  ECG at that time was unremarkable.  Kristen Rogers has been tested for thyroid dysfunction twice, and the first was reportedly positive for thyroid dysfunction while the follow-up was within normal limits. She feels as though there is something thick in her neck.    Past Medical History  Diagnosis Date  . Hypertension   . Hyperlipidemia   . Diabetes mellitus without complication     No past surgical history on file.   Current Outpatient Prescriptions  Medication Sig Dispense Refill  . cyclobenzaprine (FLEXERIL) 10 MG tablet Take 0.5-1 tablets (5-10 mg total) by mouth 2 (two) times daily as needed for muscle spasms. 20 tablet 0  . lisinopril (PRINIVIL,ZESTRIL) 10 MG tablet Take 10 mg by mouth daily.    . metformin (FORTAMET) 1000 MG (OSM) 24 hr tablet Take 1,000 mg by mouth daily with breakfast.    . Omega-3  Fatty Acids (FISH OIL TRIPLE STRENGTH PO) Take 1,500 mg by mouth daily.    . sertraline (ZOLOFT) 50 MG tablet Take 50 mg by mouth at bedtime.     No current facility-administered medications for this visit.    Allergies:   Review of patient's allergies indicates no known allergies.    Social History:  The patient  reports that she has never smoked. She does not have any smokeless tobacco history on file. She reports that she does not drink alcohol or use illicit drugs.   Family History:  The patient denies a family history of heart disease or SCD.   ROS:  Please see the history of present illness.   Otherwise, review of systems are positive for none.   All other systems are reviewed and negative.    PHYSICAL EXAM: VS:  BP 132/86 mmHg  Pulse 72  LMP 12/18/2014 , BMI There is no weight on file to calculate BMI. GENERAL:  Well appearing HEENT:  Pupils equal round and reactive, fundi not visualized, oral mucosa unremarkable NECK:  No jugular venous distention, waveform within normal limits, carotid upstroke brisk and symmetric, no bruits, L sided thyromegaly LYMPHATICS:  No cervical adenopathy LUNGS:  Clear to auscultation bilaterally HEART:  RRR.  PMI not displaced or sustained,S1 and S2 within normal limits, no S3, no S4, no clicks, no rubs, I/VI systolic murmur at the LUSB when lying down ABD:  Flat, positive bowel sounds normal in frequency in pitch, no bruits, no rebound, no guarding, no midline pulsatile mass, no hepatomegaly, no splenomegaly EXT:  2 plus pulses throughout, no edema, no cyanosis no clubbing SKIN:  No rashes no nodules NEURO:  Cranial nerves II through XII grossly intact, motor grossly intact throughout PSYCH:  Cognitively intact, oriented to person place and time    EKG:  EKG is ordered today. The ekg ordered today demonstrates sinus rhythm at 72 bpm.   Recent Labs: 12/20/2014: BUN 11; Creatinine, Ser 0.53; Hemoglobin 12.3; Platelets 303; Potassium 4.4; Sodium  131*    Lipid Panel No results found for: CHOL, TRIG, HDL, CHOLHDL, VLDL, LDLCALC, LDLDIRECT    Wt Readings from Last 3 Encounters:  08/24/14 70.67 kg (155 lb 12.8 oz)  04/07/13 75.751 kg (167 lb)  04/07/13 75.751 kg (167 lb)      Other studies Reviewed: Additional studies/ records that were reviewed today include: . Review of the above records demonstrates:  Please see elsewhere in the note.     ASSESSMENT AND PLAN:  # Palpitations/Chest pain: Kristen Rogers symptoms are atypical and not consistent with ACS. She was having mild symptoms in the office and her heart rhythm is stable. I suspect that this will not demonstrate a significant arrhythmias. However, more severe episodes have been associated with lightheadedness and near syncope. Therefore we will place a 30 day event monitor to assess for any malignant arrhythmias.  # Thyromegaly: Kristen Rogers reported a feeling of thickness in her neck. On exam she seems to have a left thyroid nodule. Given her palpitations and shortness of breath as well as her report of an abnormal thyroid function in the past, we will check a TSH and free T4. We will also order a thyroid ultrasound and forward this information to her PCP.   Current medicines are reviewed at length with the patient today.  The patient does not have concerns regarding medicines.  The following changes have been made:  no change  Labs/ tests ordered today include:   Orders Placed This Encounter  Procedures  . US Soft Tissue Head/Neck  . T4, free  . TSH  . Cardiac event monitor  . EKG 12-Lead     Disposition:   FU with Dr. Elmarie Shiley C. Raymond prn.  Signed, Madilyn Hook, MD  01/07/2015 9:11 AM    [facility]

## 2015-01-07 ENCOUNTER — Ambulatory Visit (INDEPENDENT_AMBULATORY_CARE_PROVIDER_SITE_OTHER): Payer: Self-pay | Admitting: Cardiovascular Disease

## 2015-01-07 ENCOUNTER — Ambulatory Visit
Admission: RE | Admit: 2015-01-07 | Discharge: 2015-01-07 | Disposition: A | Discharge status: Home / Self Care | Payer: No Typology Code available for payment source | Source: Ambulatory Visit | Attending: Cardiovascular Disease | Admitting: Cardiovascular Disease

## 2015-01-07 ENCOUNTER — Ambulatory Visit (INDEPENDENT_AMBULATORY_CARE_PROVIDER_SITE_OTHER): Payer: Self-pay

## 2015-01-07 ENCOUNTER — Encounter: Payer: Self-pay | Admitting: Cardiovascular Disease

## 2015-01-07 VITALS — BP 132/86 | HR 72

## 2015-01-07 DIAGNOSIS — R0602 Shortness of breath: Secondary | ICD-10-CM

## 2015-01-07 DIAGNOSIS — E049 Nontoxic goiter, unspecified: Secondary | ICD-10-CM

## 2015-01-07 DIAGNOSIS — R002 Palpitations: Secondary | ICD-10-CM

## 2015-01-07 LAB — TSH: TSH: 2.245 u[IU]/mL (ref 0.350–4.500)

## 2015-01-07 LAB — T4, FREE: Free T4: 0.74 ng/dL — ABNORMAL LOW (ref 0.80–1.80)

## 2015-01-07 NOTE — Patient Instructions (Signed)
Dr Duke Salvia has recommended that you wear an event monitor. Event monitors are medical devices that record the heart's electrical activity. Doctors most often [place] these monitors to diagnose arrhythmias. Arrhythmias are problems with the speed or rhythm of the heartbeat. The monitor is a small, portable device. You can wear one while you do your normal daily activities. This is usually used to diagnose what is causing palpitations/syncope (passing out).  Your physician recommends that you return for lab work today.  Your physician has requested that you have a thyroid ultrasound. This has been ordered at [address] in the [facility] building. Please see map provided for contact information.  Dr Duke Salvia recommends that you follow-up with her as needed.

## 2015-01-08 ENCOUNTER — Telehealth: Payer: Self-pay | Admitting: *Deleted

## 2015-01-08 ENCOUNTER — Encounter: Payer: Self-pay | Admitting: *Deleted

## 2015-01-08 NOTE — Telephone Encounter (Signed)
mailed letter - MY CHART ACTIVATION

## 2015-01-08 NOTE — Telephone Encounter (Signed)
Spoke to son. Result given. Verbalized understanding He states patient has a primary - but he does not know the doctor's name   will call back with information.

## 2015-01-08 NOTE — Telephone Encounter (Signed)
-----   Message from Chilton Si, MD sent at 01/07/2015 10:23 PM EDT ----- Thyroid function slightly low.  Follow up with PCP.

## 2015-02-13 ENCOUNTER — Telehealth: Payer: Self-pay | Admitting: *Deleted

## 2015-02-13 NOTE — Telephone Encounter (Signed)
-----   Message from Chilton Siiffany Doolittle, MD sent at 02/12/2015  5:12 PM EDT ----- Normal study.  Occasional early beats from the top chamber of the heart. Please schedule follow up first available.

## 2015-02-13 NOTE — Telephone Encounter (Signed)
Left message to call back  

## 2015-02-15 NOTE — Telephone Encounter (Signed)
Spoke to patient's son. Result given . Verbalized understanding

## 2015-02-15 NOTE — Telephone Encounter (Signed)
-----   Message from Tiffany Estacada, MD sent at 02/12/2015  5:12 PM EDT ----- Normal study.  Occasional early beats from the top chamber of the heart. Please schedule follow up first available. 

## 2015-12-17 ENCOUNTER — Ambulatory Visit (INDEPENDENT_AMBULATORY_CARE_PROVIDER_SITE_OTHER): Payer: Self-pay | Admitting: Gynecology

## 2015-12-17 ENCOUNTER — Encounter: Payer: Self-pay | Admitting: Gynecology

## 2015-12-17 VITALS — BP 136/84 | Ht 62.0 in | Wt 151.0 lb

## 2015-12-17 DIAGNOSIS — N898 Other specified noninflammatory disorders of vagina: Secondary | ICD-10-CM

## 2015-12-17 DIAGNOSIS — N946 Dysmenorrhea, unspecified: Secondary | ICD-10-CM

## 2015-12-17 LAB — WET PREP FOR TRICH, YEAST, CLUE
Clue Cells Wet Prep HPF POC: NONE SEEN
Trich, Wet Prep: NONE SEEN
WBC, Wet Prep HPF POC: NONE SEEN
YEAST WET PREP: NONE SEEN

## 2015-12-17 LAB — URINALYSIS W MICROSCOPIC + REFLEX CULTURE
Bacteria, UA: NONE SEEN [HPF]
Bilirubin Urine: NEGATIVE
CASTS: NONE SEEN [LPF]
CRYSTALS: NONE SEEN [HPF]
Glucose, UA: NEGATIVE
HGB URINE DIPSTICK: NEGATIVE
Ketones, ur: NEGATIVE
Leukocytes, UA: NEGATIVE
NITRITE: NEGATIVE
PH: 5.5 (ref 5.0–8.0)
Protein, ur: NEGATIVE
SPECIFIC GRAVITY, URINE: 1.015 (ref 1.001–1.035)
WBC, UA: NONE SEEN WBC/HPF (ref <=5)
YEAST: NONE SEEN [HPF]

## 2015-12-17 MED ORDER — IBUPROFEN 800 MG PO TABS: 800.0000 mg | ORAL_TABLET | Freq: Three times a day (TID) | ORAL | 3 refills | Status: DC | PRN

## 2015-12-17 NOTE — Progress Notes (Signed)
   HPI: Patient is a 40 year old gravida 3 para 3 (prior tubal ligation) a presented to the office today with a complaint of vaginal irritation slight discharge and painful menstrual cycles. She reports normal menstrual cycles once a month lasting 3-4 days but with a lot of cramping. She denies any fever, chills, nausea, vomiting or any dysuria or frequency or any back pain. No GI complaints reported..   ROS: A ROS was performed and pertinent positives and negatives are included in the history.  GENERAL: No fevers or chills. HEENT: No change in vision, no earache, sore throat or sinus congestion. NECK: No pain or stiffness. CARDIOVASCULAR: No chest pain or pressure. No palpitations. PULMONARY: No shortness of breath, cough or wheeze. GASTROINTESTINAL: No abdominal pain, nausea, vomiting or diarrhea, melena or bright red blood per rectum. GENITOURINARY: No urinary frequency, urgency, hesitancy or dysuria. MUSCULOSKELETAL: No joint or muscle pain, no back pain, no recent trauma. DERMATOLOGIC: No rash, no itching, no lesions. ENDOCRINE: No polyuria, polydipsia, no heat or cold intolerance. No recent change in weight. HEMATOLOGICAL: No anemia or easy bruising or bleeding. NEUROLOGIC: No headache, seizures, numbness, tingling or weakness. PSYCHIATRIC: No depression, no loss of interest in normal activity or change in sleep pattern.   PE: Blood pressure 136/84 Gen. appearance well-developed well-nourished edema with the above-mentioned complaining Back: No CVA tenderness Abdomen: Soft nontender no rebound or guarding Pelvic: Bartholin urethra Skene was within normal limits Vagina: No lesions or discharge Cervix: No lesions or discharge Uterus: Anteverted normal size shape and consistency Adnexa: No palpable masses or tenderness Rectal exam not done  Wet prep few bacteria  Urinalysis essentially negative   Assessment Plan: Patient was nonspecific vaginal irritation a GC and Chlamydia culture was  obtained today result pending at time of this dictation. Patient may use Replens vaginal moisturizer 3 times a week. For her dysmenorrhea on point prescribed Motrin 800 mg one by mouth 3 times a day when necessary during her menses. We'll monitor her symptoms over the next 3 months if not she'll return back to the office. Culture pending.    Greater than 50% of time was spent in counseling and coordinating care of this patient.   Time of consultation: 20   Minutes.

## 2015-12-17 NOTE — Patient Instructions (Signed)
Dismenorrea  (Dysmenorrhea)  Los cólicos menstruales (dismenorrea) se originan en los espasmos musculares del útero (contracciones) durante el período menstrual. En algunas mujeres, el malestar es sólo una molestia. En otras, la dismenorrea puede ser tan intensa que interfiere con las actividades diarias durante algunos días, todos los meses.  La dismenorrea primaria son los cólicos menstruales que duran algunos días al inicio de los períodos o poco después. En general se inician después de que la adolescente comienza a tener sus períodos. A medida que la mujer avanza en edad, o tiene un bebé, los cólicos generalmente disminuyen o desaparecen. La dismenorrea secundaria comienza en épocas posteriores de la vida, dura más tiempo, y el dolor puede ser más intenso. El dolor puede comenzar antes del período y durar hasta algunos días después.   CAUSAS   La causa de la dismenorrea generalmente es un problema subyacente, por ejemplo:  · El tejido que recubre interiormente al útero crece fuera del útero en otras áreas del cuerpo (endometriosis).  · The endometrial tissue, which normally lines the uterus, is found in or grows into the muscular walls of the uterus (adenomyosis).  · Los vasos sanguíneos de la pelvis se congestionan con sangre antes del período menstrual (síndrome congestivo pélvico).  · Crecimiento excesivo de las células (pólipos) del revestimiento interior del útero o del cuello uterino.  · Caída del útero (prolapso) debido a distensión o flojedad de los ligamentos.  · Depresión.  · Problemas como infección o inflamación en la vejiga.  · Problemas en el intestino, un tumor, o síndrome de colon irritable.  · Cáncer de los órganos femeninos o en la vejiga.  · Un útero muy inclinado.  · Una apertura muy estrecha o el cuello uterino cerrado.  · Tumores no cancerosos en el útero (fibromas).  · Enfermedad pélvica inflamatoria (EPI)  · Cicatrices en la pelvis (adherencias) por cirugías previas.  · Quiste  ovárico  · El uso del dispositivo intrauterino (DIU) como método anticonceptivo.  FACTORES DE RIESGO  Puede tener más riesgo de dismenorrea si:  · Tiene menos de 30 años.  · La pubertad se inició temprano.  · Tiene sangrado irregular o abundante.  · No ha tenido hijos.  · Tiene una historia familiar de este problema.  · Es fumadora.  SIGNOS Y SÍNTOMAS   · Cólicos y dolor punzante en la zona inferior del abdomen.  · Dolores de cabeza.  · Dolor de cintura.  · Náuseas o vómitos.  · Diarrea.  · Sudoración o mareos.  · Heces blandas.  DIAGNÓSTICO   El diagnóstico se basa en la historia personal, los síntomas, el examen físico, las pruebas diagnósticas o procedimientos. Las pruebas diagnósticas o procedimientos pueden ser:  · Análisis de sangre.  · Ecografías.  · Examen de las capas que cubren el útero (dilatación y curetaje, D&C).  · Examen de la zona interna del abdomen o la pelvis con un laparoscopio.  · Radiografías.  · Tomografía computada.  · Resonancia magnética.  · Un examen del interior de la vejiga con un citoscopio.  · Examen del interior del intestino o el estómago con un dispositivo especial (colonoscopio o un gastroscopio).  TRATAMIENTO   El tratamiento depende de la causa de la dismenorrea. El tratamiento puede incluir:  · Analgésicos indicados por su médico.  · Píldoras anticonceptivas o un DIU con progesterona.  · Terapia de reemplazo hormonal.  · Antiinflamatorios no esteroides (AINE). Los antiinflamatorios pueden detener la producción de prostaglandinas.  · Cirugía para extirpar   las adherencias, endometriosis, quiste de ovarios o fibromas.  · Extirpación del útero (histerectomía).  · Inyecciones de progesterona para detener el período menstrual.  · Corte de los nervios del sacro que van hacia los órganos femeninos (neurectomía presacra).  · Corriente eléctrica en los nervios sacros (estimulación de los nervios sacros).  · Medicamentos antidepresivos.  · Terapia psiquiátrica, psicoterapia individual o de  grupo.  · Actividad física y fisioterapia.  · Meditación y yoga.  · Acupuntura.  INSTRUCCIONES PARA EL CUIDADO EN EL HOGAR   · Tome sólo medicamentos de venta libre o recetados, según las indicaciones del médico.  · Coloque una almohadilla eléctrica o una botella con agua caliente en la zona inferior del abdomen. No duerma con la almohadilla térmica.  · Haga ejercicios aeróbicos como caminar, nadar, andar en bicicleta, para aliviar los cólicos.  · Masajear la zona inferior de la espalda o el abdomen puede ser de ayuda.  · Deje de fumar.  · Evite la cafeína y el alcohol.  SOLICITE ATENCIÓN MÉDICA SI:   · El dolor no mejora con los medicamentos recetados.  · Tiene dolor durante las relaciones sexuales.  · El dolor aumenta y no puede controlarlo con los medicamentos.  · Observa una hemorragia vaginal anormal con el período.  · Tiene náuseas o vómitos con el período menstrual que no puede controlar con medicamentos.  SOLICITE ATENCIÓN MÉDICA DE INMEDIATO SI:   Se desmaya.      Esta información no tiene como fin reemplazar el consejo del médico. Asegúrese de hacerle al médico cualquier pregunta que tenga.     Document Released: 01/14/2005 Document Revised: 12/07/2012  Elsevier Interactive Patient Education ©2016 Elsevier Inc.

## 2015-12-18 LAB — GC/CHLAMYDIA PROBE AMP
CT PROBE, AMP APTIMA: NOT DETECTED
GC Probe RNA: NOT DETECTED

## 2015-12-18 LAB — URINE CULTURE: ORGANISM ID, BACTERIA: NO GROWTH

## 2016-01-23 IMAGING — CR DG CHEST 2V
2 series · 2 of 2 positions shown · non-contrast
Comparison: PA and lateral chest 04/07/2013.  CT chest 01/11/2011.

CLINICAL DATA: Intermittent chest pain for 1 month. Initial
encounter.

EXAM:
CHEST  2 VIEW

[w chest pa]
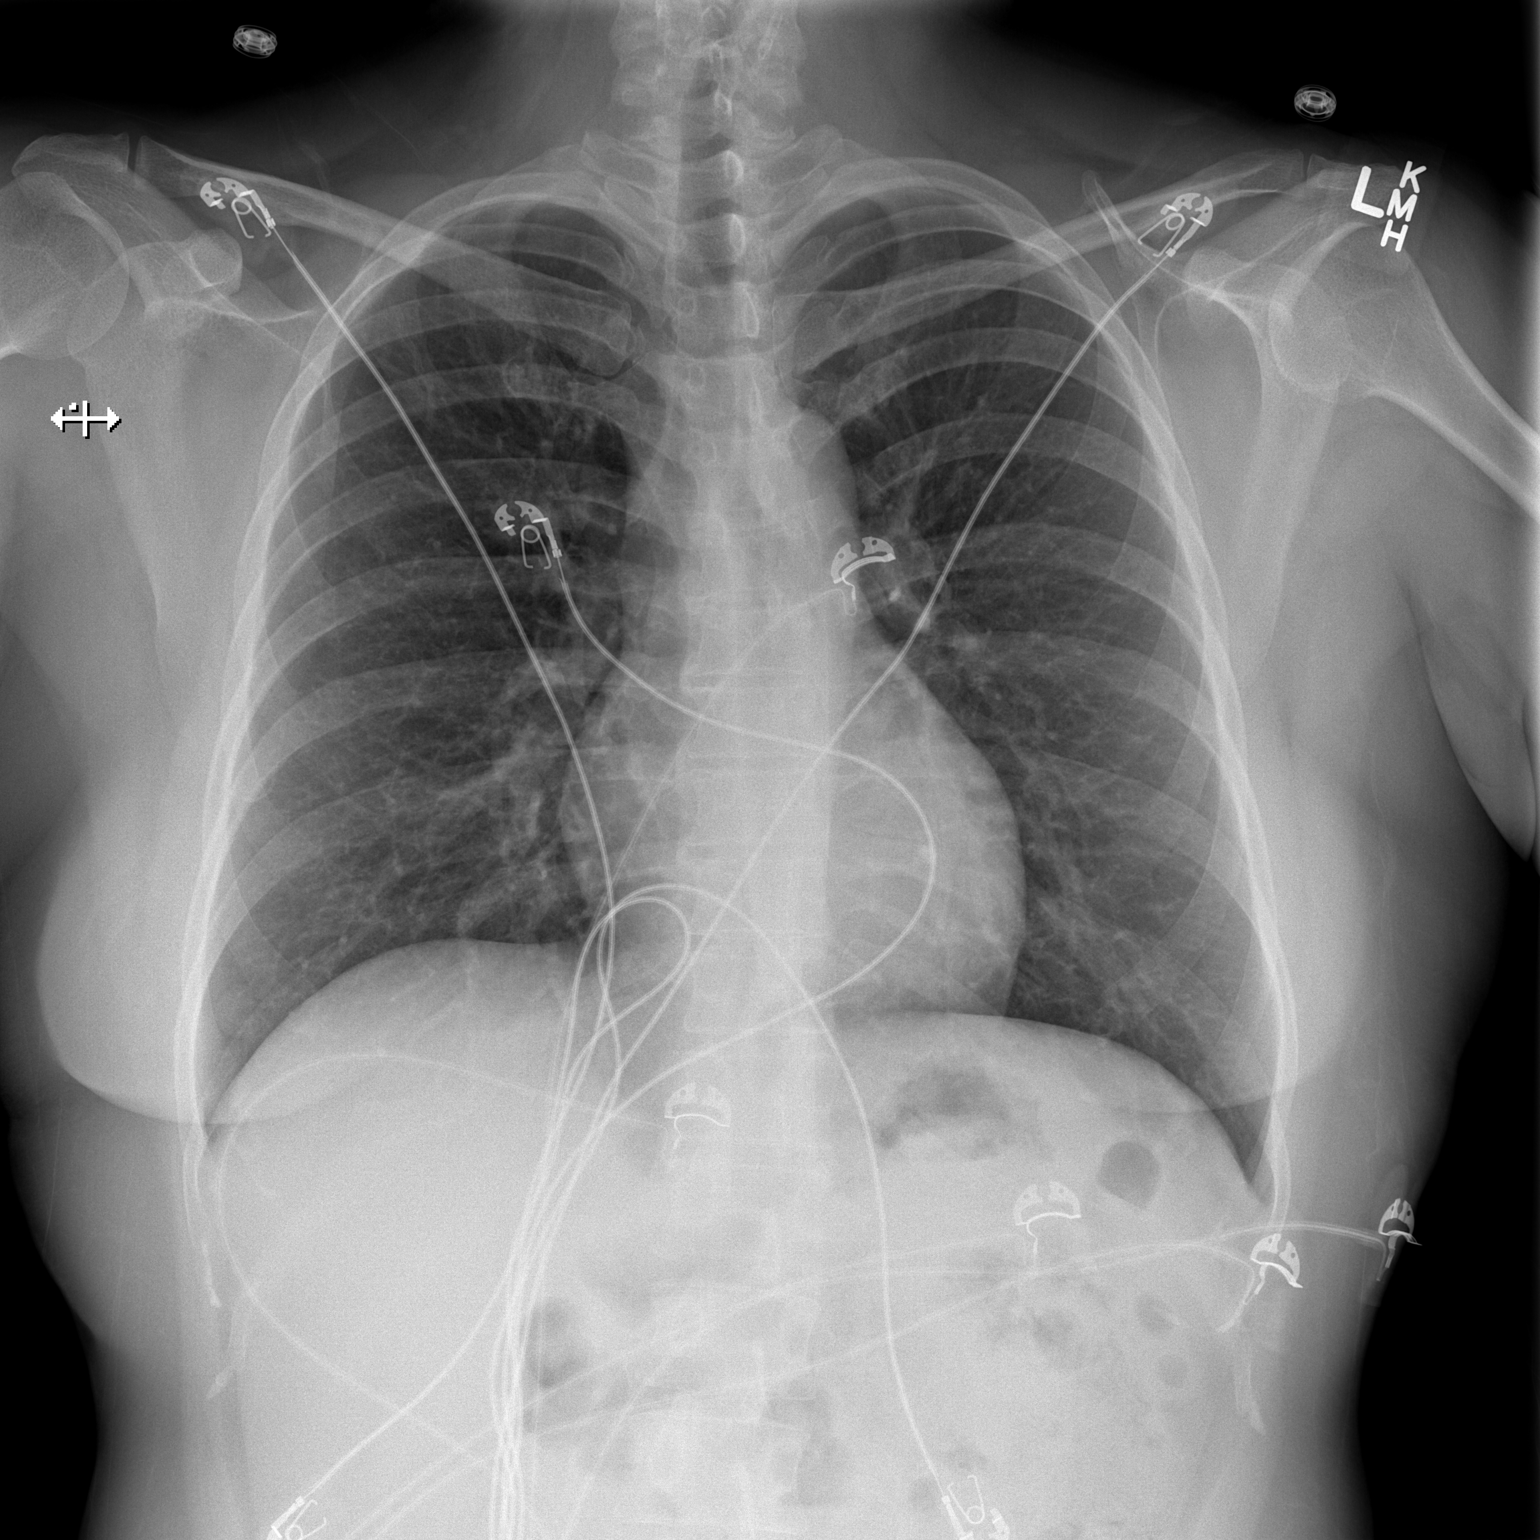

[w chest lat]
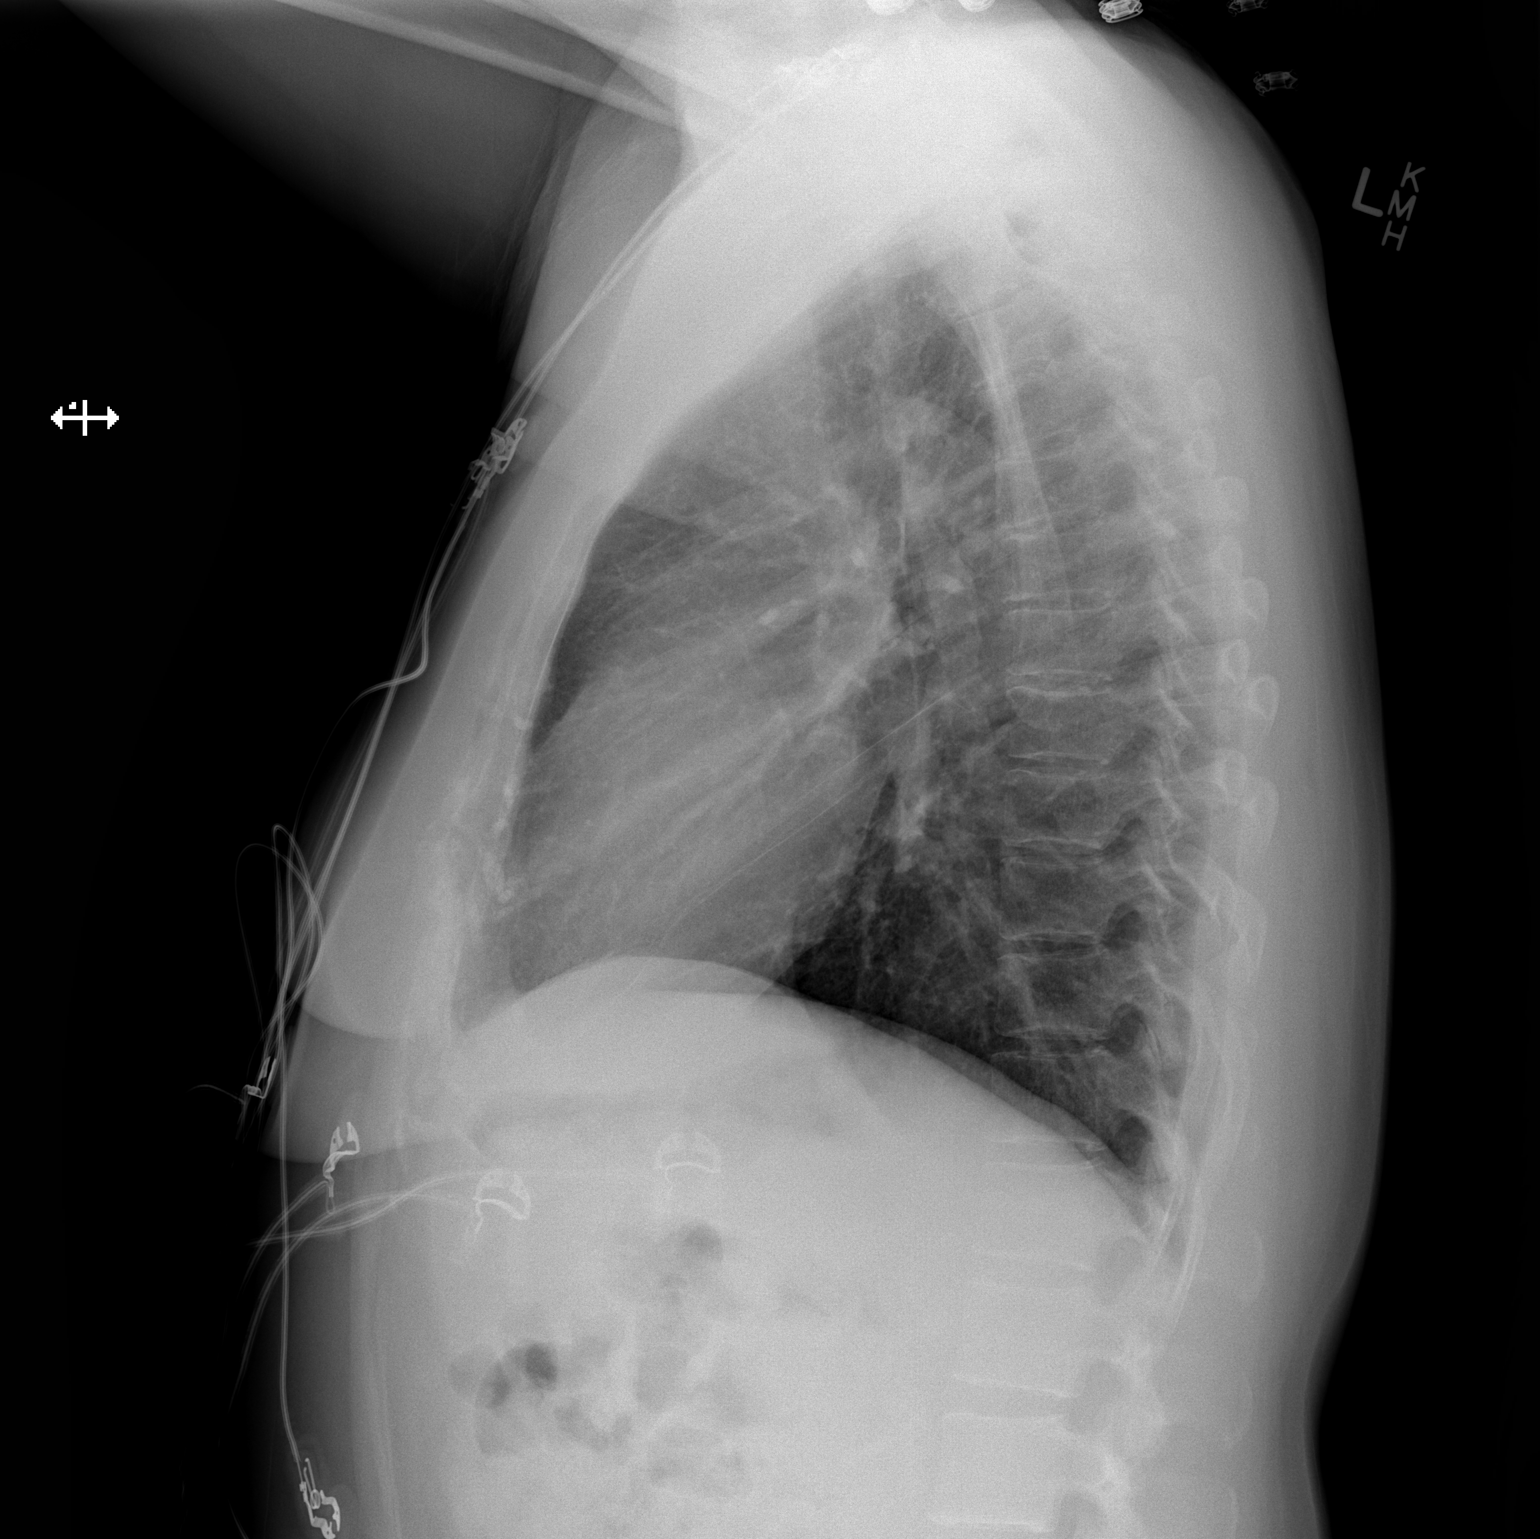

[2 of 2 positions shown; findings below may reference images not displayed]

FINDINGS: Heart size and mediastinal contours are within normal limits. Both
lungs are clear. Visualized skeletal structures are unremarkable.
IMPRESSION: Negative exam.

## 2016-02-10 IMAGING — US US SOFT TISSUE HEAD/NECK
1 series · 14 of 25 positions shown · non-contrast
Comparison: None.

CLINICAL DATA: 39-year-old female with a history of thyroid nodules

EXAM:
THYROID ULTRASOUND
TECHNIQUE: Ultrasound examination of the thyroid gland and adjacent soft
tissues was performed.

[Series 1: us soft tissue head/neck · 0.08mm/px · 14 of 44 slices shown]
[im 1/44]
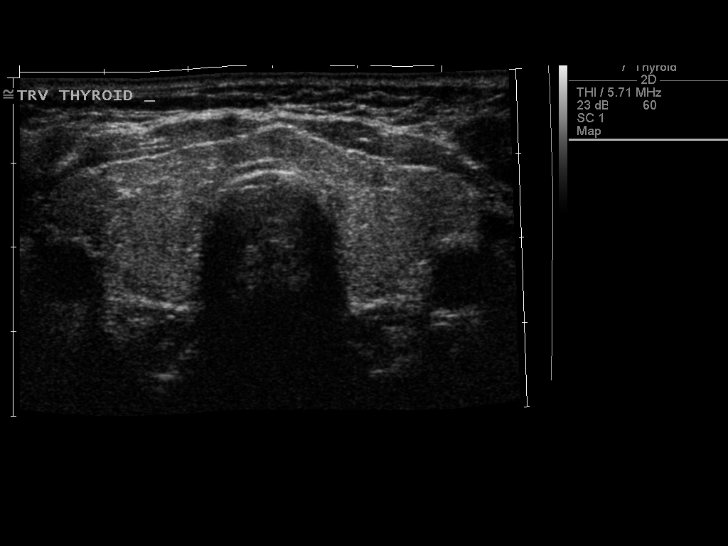
[im 4/44]
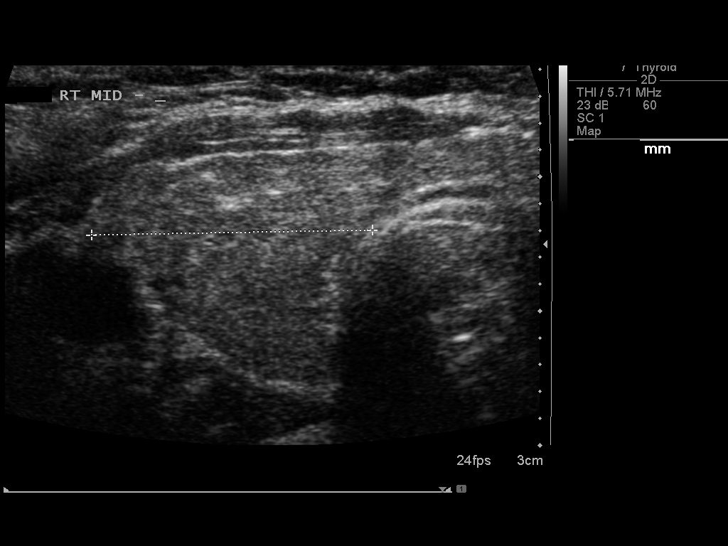
[im 8/44]
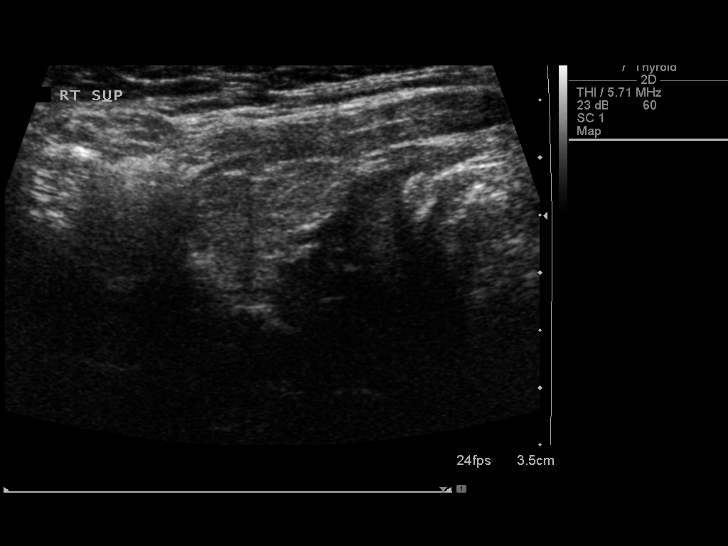
[im 11/44]
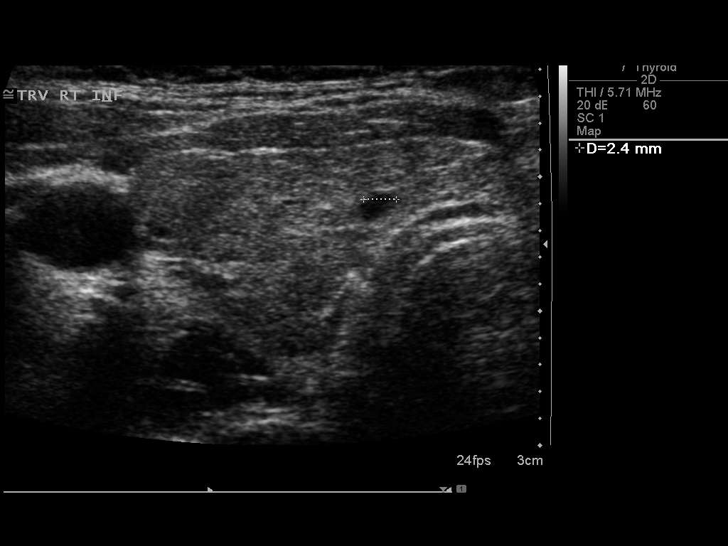
[im 15/44]
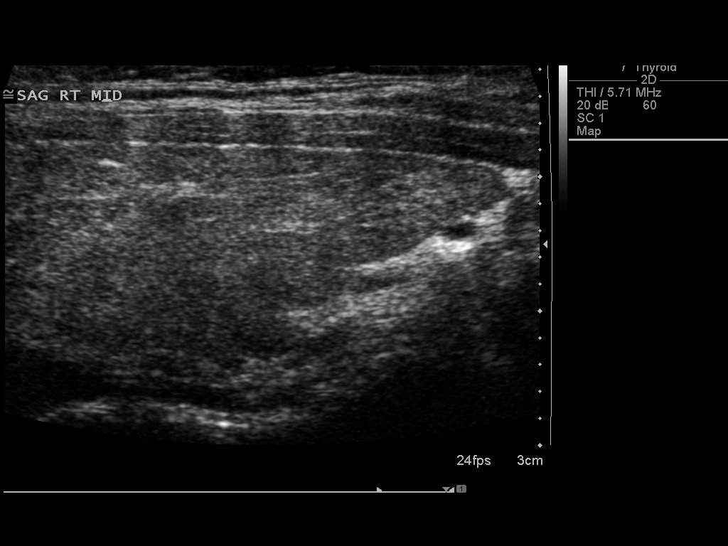
[im 17/44]
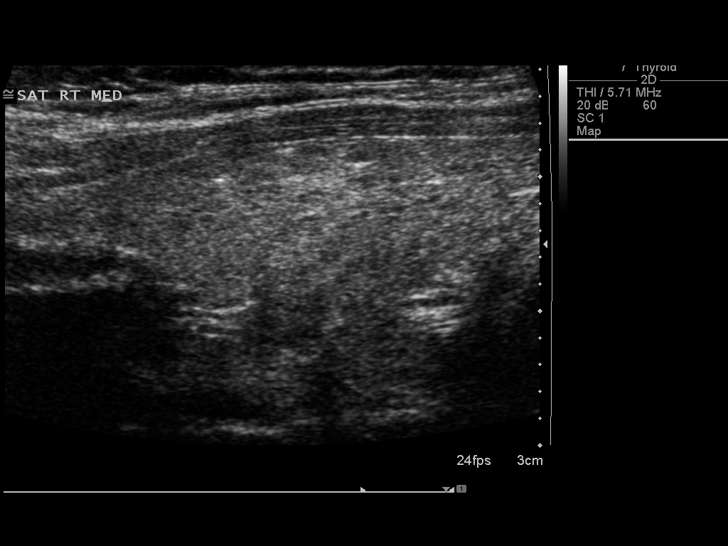
[im 20/44]
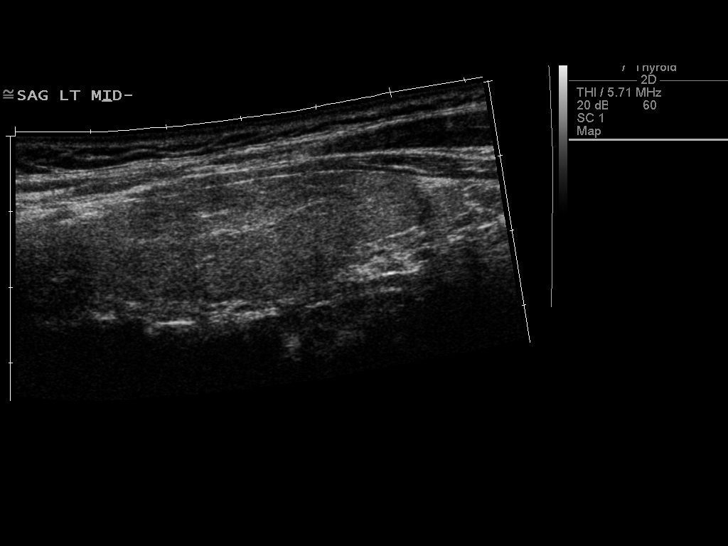
[im 24/44]
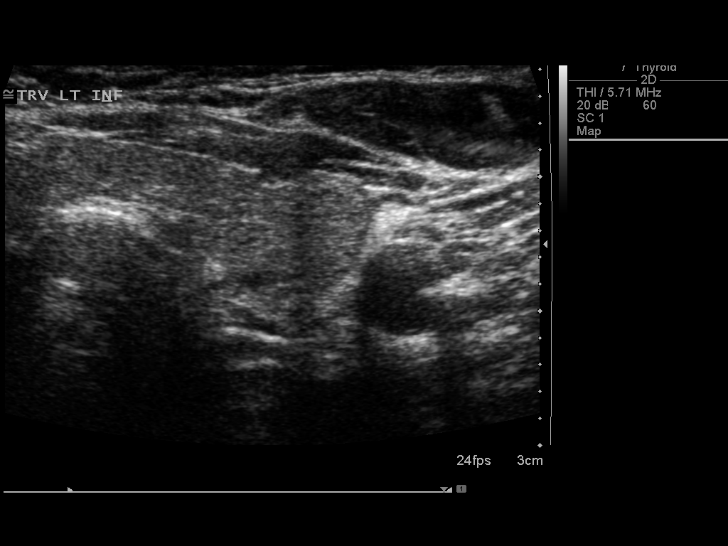
[im 27/44]
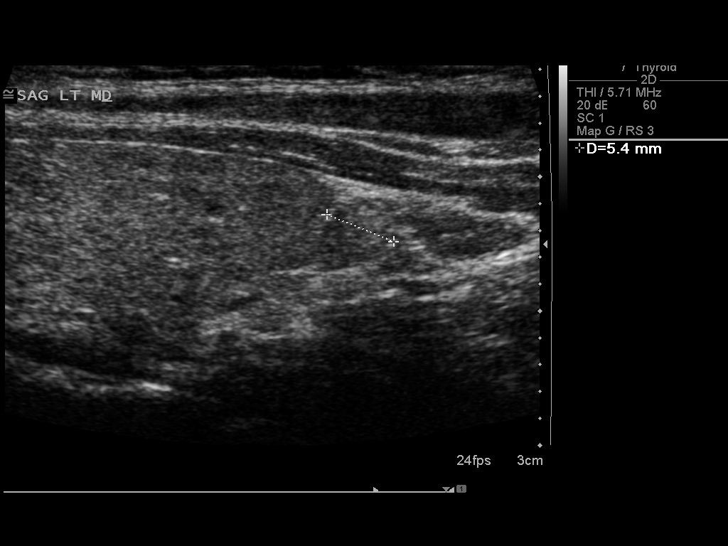
[im 29/44]
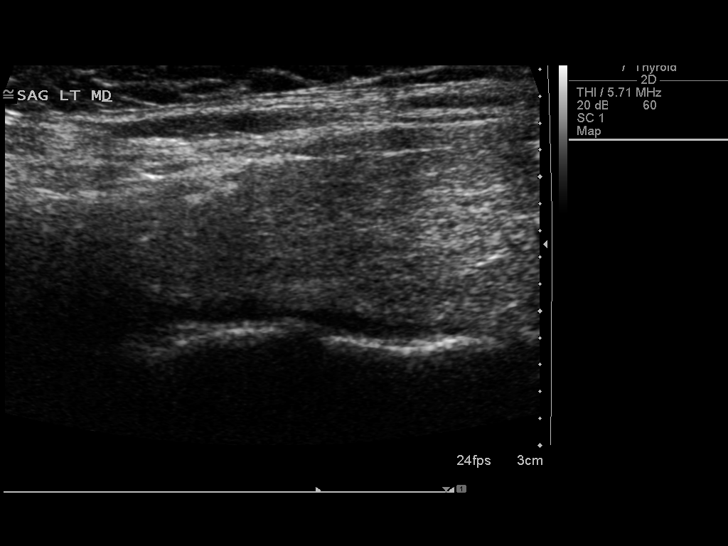
[im 33/44]
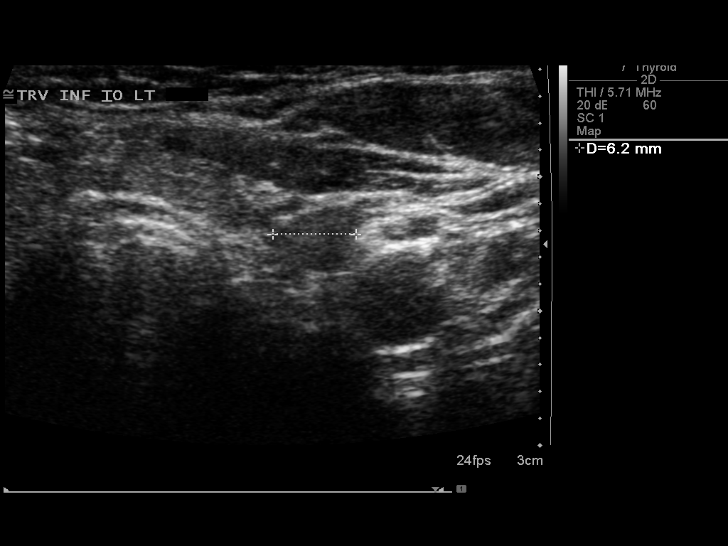
[im 36/44]
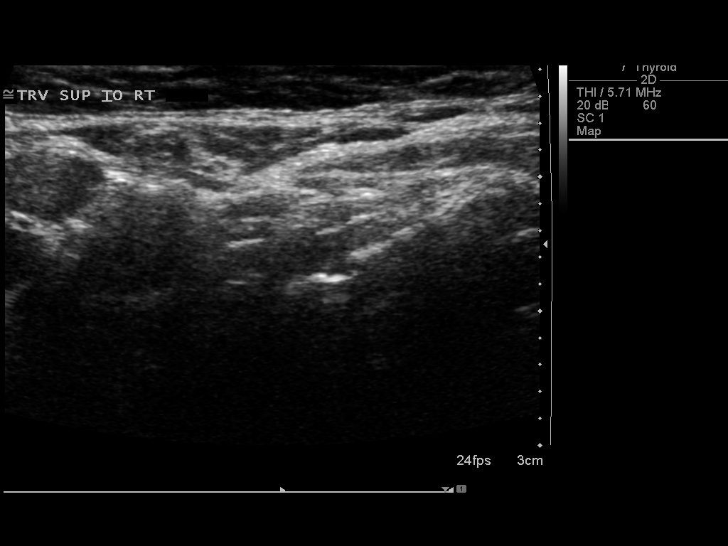
[im 40/44]
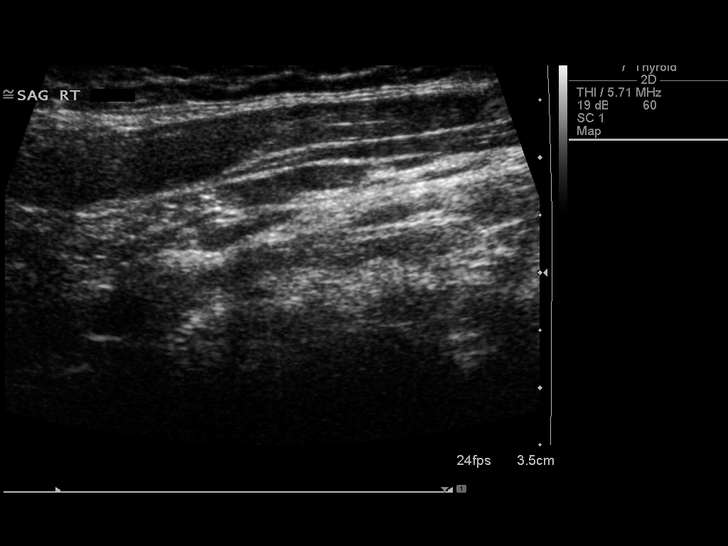
[im 44/44]
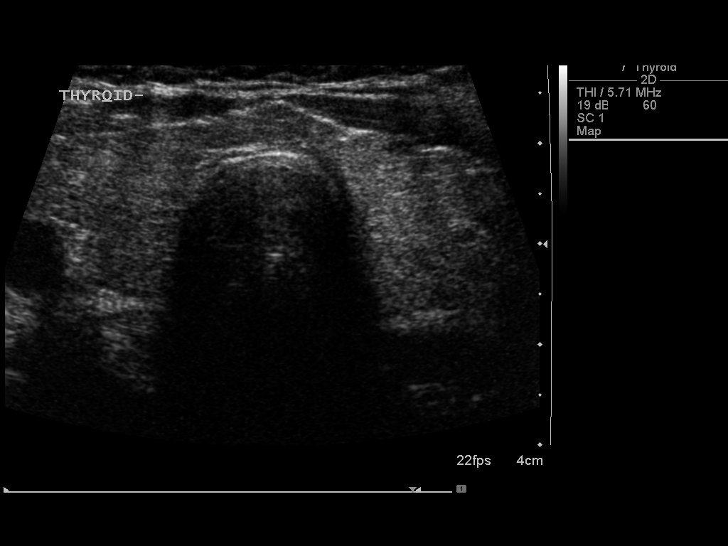

[14 of 25 positions shown; findings below may reference images not displayed]

FINDINGS: Right thyroid lobe

Measurements: 5.4 cm x 1.5 cm x 2.1 cm. Heterogeneous echotexture of
the right thyroid. Small hypoechoic nodule measuring 2 mm medially.
No significant increased flow.

Left thyroid lobe

Measurements: 5.3 cm x 1.7 cm x 2.0 cm. Heterogeneous echotexture of
the left thyroid. There is a exophytic nodule or potentially
parathyroid gland inferior to the left thyroid measuring 5 mm. No
significant increased flow within the left thyroid.

Isthmus

Thickness: 5 mm.  No nodules visualized.

Lymphadenopathy

None visualized.
IMPRESSION: Heterogeneous appearance of the thyroid, compatible with spectrum of
thyroiditis.

Questionable exophytic extension of the left thyroid inferiorly
versus parathyroid nodule. Correlation with lab studies may be
useful.

## 2016-09-02 ENCOUNTER — Encounter: Payer: Self-pay | Admitting: Gynecology

## 2016-10-06 ENCOUNTER — Ambulatory Visit (INDEPENDENT_AMBULATORY_CARE_PROVIDER_SITE_OTHER): Payer: Self-pay | Admitting: Urgent Care

## 2016-10-06 ENCOUNTER — Encounter: Payer: Self-pay | Admitting: Urgent Care

## 2016-10-06 VITALS — BP 138/80 | HR 73 | Temp 97.2°F | Resp 16 | Ht 62.0 in | Wt 148.4 lb

## 2016-10-06 DIAGNOSIS — R7989 Other specified abnormal findings of blood chemistry: Secondary | ICD-10-CM

## 2016-10-06 DIAGNOSIS — J029 Acute pharyngitis, unspecified: Secondary | ICD-10-CM

## 2016-10-06 DIAGNOSIS — R946 Abnormal results of thyroid function studies: Secondary | ICD-10-CM

## 2016-10-06 DIAGNOSIS — H6690 Otitis media, unspecified, unspecified ear: Secondary | ICD-10-CM

## 2016-10-06 LAB — POCT RAPID STREP A (OFFICE): Rapid Strep A Screen: NEGATIVE

## 2016-10-06 MED ORDER — AMOXICILLIN 875 MG PO TABS: 875.0000 mg | ORAL_TABLET | Freq: Two times a day (BID) | ORAL | 0 refills | Status: AC

## 2016-10-06 NOTE — Patient Instructions (Addendum)
Otitis media - Adultos (Otitis Media, Adult) La otitis media es la irritacin, dolor e inflamacin (hinchazn) en el espacio que se encuentra detrs del tmpano (odo medio). La causa puede ser Vella Raringuna alergia o una infeccin. Generalmente aparece junto con un resfro. CUIDADOS EN EL HOGAR  Tome los medicamentos segn las indicaciones. Finalice la prescripcin completa, aunque se sienta mejor.  Solo tome medicamentos de venta libre o recetados para Chief Technology Officerel dolor, Dentistmalestar o fiebre, como le indique el mdico.  Kansasoncurra a las consultas de control con el mdico, segn las indicaciones.  SOLICITE AYUDA SI:  Tiene otitis media slo en un odo o sangra por la nariz, o ambas cosas.  Advierte un bulto en el cuello.  No mejora luego de 3-5 das.  Empeora en lugar de mejorar.  SOLICITE AYUDA DE INMEDIATO SI:  Siente un dolor intenso y no lo Engelhard Corporationalivian los medicamentos.  Tiene irritacin, hinchazn o dolor en el odo.  Presenta rigidez en el cuello.  No puede mover una parte de su rostro (parlisis).  Nota que el hueso que se encuentra detrs de su oreja le duele al tocarlo.  ASEGRESE DE QUE:  Comprende estas instrucciones.  Controlar su afeccin.  Recibir ayuda de inmediato si no mejora o si empeora.  Esta informacin no tiene Theme park managercomo fin reemplazar el consejo del mdico. Asegrese de hacerle al mdico cualquier pregunta que tenga. Document Released: 05/09/2010 Document Revised: 04/27/2014 Document Reviewed: 11/01/2012 Elsevier Interactive Patient Education  2017 [employer]     IF you received an x-ray today, you will receive an invoice from [facility]. Please contact [facility] at [phone] with questions or concerns regarding your invoice.   IF you received labwork today, you will receive an invoice from [facility]. Please contact [facility] at [phone] with questions or concerns regarding your invoice.   Our billing staff will not be able to assist you with  questions regarding bills from these companies.  You will be contacted with the lab results as soon as they are available. The fastest way to get your results is to activate your My Chart account. Instructions are located on the last page of this paperwork. If you have not heard from us regarding the results in 2 weeks, please contact this office.

## 2016-10-06 NOTE — Progress Notes (Signed)
  MRN: [id number] DOB: 06/23/1965  Subjective:   Kristen Rogers is a 41 y.o. female presenting for chief complaint of throat pain (started yesterday) and Ear Pain (started yesterday; both ears; more pain on the right side)  Reports 8 day history sore throat, headache, nasal congestion, neck discomfort, mild cough, bilateral ear pain (R>L), upper back discomfort. Has not tried any medications for symptom relief. Denies fever, sinus pain, ear drainage, chest pain, shob, wheezing, n/v, abdominal pain, rash. Denies history of allergies, asthma. Denies smoking cigarettes. Patient would also like to have her thyroid levels checked. She has a history of elevated tsh but repeat labs were normal. She is concerned that these symptoms may be related to her thyroid.   Kristen has a current medication list which includes the following prescription(s): glimepiride. Also has No Known Allergies. Kristen denies past medical and surgical history.   Objective:   Vitals: BP 138/80   Pulse 73   Temp 97.2 F (36.2 C) (Oral)   Resp 16   Ht 5\' 2"  (1.575 m)   Wt 148 lb 6.4 oz (67.3 kg)   LMP 09/21/2016   SpO2 99%   BMI 27.14 kg/m   Physical Exam  Constitutional: She is oriented to person, place, and time. She appears well-developed and well-nourished.  HENT:  Right TM and proximal ear canal with erythema but no discharge. Left TM normal. Nasal turbinates pink and moist, nasal passages patent. No sinus tenderness. Oropharynx clear, mucous membranes moist.  Eyes: Right eye exhibits no discharge. Left eye exhibits no discharge.  Neck: Normal range of motion. Neck supple. No thyromegaly present.  Cardiovascular: Normal rate, regular rhythm and intact distal pulses.  Exam reveals no gallop and no friction rub.   No murmur heard. Pulmonary/Chest: No respiratory distress. She has no wheezes. She has no rales.  Lymphadenopathy:    She has cervical adenopathy (right sided).  Neurological: She is alert  and oriented to person, place, and time.  Skin: Skin is warm and dry.   Results for orders placed or performed in visit on 10/06/16 (from the past 24 hour(s))  POCT rapid strep A     Status: None   Collection Time: 10/06/16  3:49 PM  Result Value Ref Range   Rapid Strep A Screen Negative Negative   Assessment and Plan :   1. Sore throat 2. Acute otitis media, unspecified otitis media type - Will cover for otitis media with amoxicillin. Return-to-clinic precautions discussed, patient verbalized understanding.  - amoxicillin (AMOXIL) 875 MG tablet; Take 1 tablet (875 mg total) by mouth 2 (two) times daily.  Dispense: 20 tablet; Refill: 0  3. Abnormal TSH - Labs pending.  Wallis BambergMario Annalynn Centanni, PA-C Primary Care at [facility] [phone] 10/06/2016  3:23 PM

## 2016-10-07 LAB — TSH: TSH: 3.29 u[IU]/mL (ref 0.450–4.500)

## 2017-08-10 ENCOUNTER — Encounter: Payer: Self-pay | Admitting: Family Medicine

## 2017-08-19 ENCOUNTER — Other Ambulatory Visit: Payer: Self-pay

## 2017-08-19 ENCOUNTER — Ambulatory Visit: Payer: Self-pay | Admitting: Family Medicine

## 2017-08-19 ENCOUNTER — Encounter: Payer: Self-pay | Admitting: Family Medicine

## 2017-08-19 VITALS — BP 120/86 | HR 78 | Temp 98.6°F | Ht 64.0 in | Wt 152.8 lb

## 2017-08-19 DIAGNOSIS — Z01419 Encounter for gynecological examination (general) (routine) without abnormal findings: Secondary | ICD-10-CM

## 2017-08-19 DIAGNOSIS — E119 Type 2 diabetes mellitus without complications: Secondary | ICD-10-CM

## 2017-08-19 DIAGNOSIS — Z113 Encounter for screening for infections with a predominantly sexual mode of transmission: Secondary | ICD-10-CM

## 2017-08-19 LAB — POCT URINALYSIS DIP (MANUAL ENTRY)
Bilirubin, UA: NEGATIVE
Blood, UA: NEGATIVE
Glucose, UA: 100 mg/dL — AB
Ketones, POC UA: NEGATIVE mg/dL
Leukocytes, UA: NEGATIVE
Nitrite, UA: NEGATIVE
Protein Ur, POC: NEGATIVE mg/dL
Spec Grav, UA: 1.02 (ref 1.010–1.025)
Urobilinogen, UA: 0.2 E.U./dL
pH, UA: 5 (ref 5.0–8.0)

## 2017-08-19 LAB — POCT GLYCOSYLATED HEMOGLOBIN (HGB A1C): Hemoglobin A1C: 6

## 2017-08-19 MED ORDER — GLIPIZIDE 5 MG PO TABS: 5.0000 mg | ORAL_TABLET | Freq: Every day | ORAL | 3 refills | Status: AC

## 2017-08-19 MED ORDER — METFORMIN HCL 1000 MG PO TABS: 1000.0000 mg | ORAL_TABLET | Freq: Two times a day (BID) | ORAL | 3 refills | Status: AC

## 2017-08-19 MED ORDER — LISINOPRIL 10 MG PO TABS: 10.0000 mg | ORAL_TABLET | Freq: Every day | ORAL | 3 refills | Status: AC

## 2017-08-19 NOTE — Progress Notes (Signed)
5/2/20199:05 AM  Kristen Rogers 1976-01-31, 42 y.o. female [id number]  Chief Complaint  Patient presents with  . Gynecologic Exam    also wanting STD labs drawn    HPI:   Patient is a 42 y.o. female with past medical history significant for DM2, HTN who presents today for CPE  Last CPE 2018 Cervical Cancer Screening: 2018, does not know results, denies h/o abnormal Breast Cancer Screening: n/a Colorectal Cancer Screening: n/a Bone Density Testing: n/a HIV Screening: ordered today STI Screening: ordered today Seasonal Influenza Vaccination: will need this season Td/Tdap Vaccination: declines, self-pay Pneumococcal Vaccination: will get at next visit Zoster Vaccination: n/a Frequency of Dental evaluation: has not seen in years Frequency of Eye evaluation: has not seen in years  G3P3, SVD x 3 Menses regular, normal flow BTL Married, 3 adult children live in [place] From [place], in [place] 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 No     Depression screen PHQ 2/9 08/19/2017  Decreased Interest 0  Down, Depressed, Hopeless 0  PHQ - 2 Score 0    No Known Allergies  Prior to Admission medications   Medication Sig Start Date End Date Taking? Authorizing Provider  lisinopril (PRINIVIL,ZESTRIL) 10 MG tablet Take 10 mg by mouth daily.   Yes [provider]  metformin  tablet Take 1,000 mg by mouth twice a day with a meal   Yes [provider]  Glipizide  tablet daily with breakfast  Past Medical History:  Diagnosis Date  . Diabetes mellitus without complication (HCC)   . Hyperlipidemia   . Hypertension     Past Surgical History:  Procedure Laterality Date  . TUBAL LIGATION      Social History   Tobacco Use  . Smoking status: Never Smoker   . Smokeless tobacco: Never Used  Substance Use Topics  . Alcohol use: Yes    Comment: SOCIAL    Family History  Problem Relation Age of Onset  . Diabetes Mother   . Hypertension Mother   . Cancer Father 65       COLON  . Cancer Paternal Uncle        STOMACH  . Diabetes Sister     Review of Systems  Constitutional: Negative for chills, fever, malaise/fatigue and weight loss.  HENT: Negative for congestion, ear pain and sore throat.   Eyes: Negative for blurred vision and double vision.  Respiratory: Negative for cough and shortness of breath.   Cardiovascular: Negative for chest pain, palpitations and leg swelling.  Gastrointestinal: Negative for abdominal pain, blood in stool, constipation, diarrhea, melena, nausea and vomiting.  Genitourinary: Negative for dysuria and hematuria.       Neg breast lumps or nipple discharge Neg vaginal discharge, pelvic pain, dyspareunia, abnormal vaginal bleeding  Musculoskeletal: Negative for joint pain and myalgias.  Neurological: Negative for dizziness, sensory change, speech change, focal weakness and headaches.  Endo/Heme/Allergies: Negative for polydipsia.  Psychiatric/Behavioral: Negative for depression. The patient is not nervous/anxious and does not have insomnia.      OBJECTIVE:  Blood pressure 120/86, pulse 78, temperature 98.6 F (37 C), temperature source Oral, height  (1.626 m), weight 152 lb 12.8 oz (69.3 kg), last menstrual period 08/15/2017, SpO2 97 %.  Physical Exam  Constitutional: She  is oriented to person, place, and time. She appears well-developed and well-nourished.  HENT:  Head: Normocephalic and atraumatic.  Right Ear: Hearing, tympanic membrane, external ear and ear canal normal.  Left Ear: Hearing, tympanic membrane, external ear and ear canal normal.  Mouth/Throat: Oropharynx is clear and moist.  Eyes: Pupils are equal, round, and reactive to light. Conjunctivae and EOM are normal.  Neck: Neck supple.  No thyromegaly present.  Cardiovascular: Normal rate, regular rhythm, normal heart sounds and intact distal pulses. Exam reveals no gallop and no friction rub.  No murmur heard. Pulmonary/Chest: Effort normal and breath sounds normal. She has no wheezes. She has no rhonchi. She has no rales. Right breast exhibits no inverted nipple, no mass, no nipple discharge, no skin change and no tenderness. Left breast exhibits no inverted nipple, no mass, no nipple discharge, no skin change and no tenderness. Breasts are symmetrical.  Abdominal: Soft. Bowel sounds are normal. She exhibits no distension. There is no hepatosplenomegaly. There is no tenderness. There is no guarding.  Genitourinary: There is no rash or lesion on the right labia. There is no rash or lesion on the left labia. Uterus is not enlarged, not fixed and not tender. Cervix exhibits no motion tenderness, no discharge and no friability. Right adnexum displays no mass and no tenderness. Left adnexum displays no mass and no tenderness. No erythema in the vagina. Vaginal discharge (yellow, greenish, no odor) found.  Musculoskeletal: Normal range of motion. She exhibits no edema.  Lymphadenopathy:    She has no cervical adenopathy.    She has no axillary adenopathy.       Right: No supraclavicular adenopathy present.       Left: No supraclavicular adenopathy present.  Neurological: She is alert and oriented to person, place, and time. She has normal reflexes.  Skin: Skin is warm and dry.  Psychiatric: She has a normal mood and affect.  Nursing note and vitals reviewed.    Diabetic Foot Exam - Simple   Simple Foot Form Diabetic Foot exam was performed with the following findings:  Yes 08/19/2017  9:58 AM  Visual Inspection No deformities, no ulcerations, no other skin breakdown bilaterally:  Yes Sensation Testing Intact to touch and monofilament testing bilaterally:  Yes Pulse Check Posterior Tibialis and Dorsalis pulse intact bilaterally:   Yes Comments Felt all pricks with the filament, no broken skin     Results for orders placed or performed in visit on 08/19/17 (from the past 24 hour(s))  POCT glycosylated hemoglobin (Hb A1C)     Status: None   Collection Time: 08/19/17  8:54 AM  Result Value Ref Range   Hemoglobin A1C 6.0   POCT urinalysis dipstick     Status: Abnormal   Collection Time: 08/19/17  8:54 AM  Result Value Ref Range   Color, UA yellow yellow   Clarity, UA clear clear   Glucose, UA =100 (A) negative mg/dL   Bilirubin, UA negative negative   Ketones, POC UA negative negative mg/dL   Spec Grav, UA 4.098 1.191 - 1.025   Blood, UA negative negative   pH, UA 5.0 5.0 - 8.0   Protein Ur, POC negative negative mg/dL   Urobilinogen, UA 0.2 0.2 or 1.0 E.U./dL   Nitrite, UA Negative Negative   Leukocytes, UA Negative Negative     ASSESSMENT and PLAN  1. Encounter for annual routine gynecological examination No concerns per history or exam. Routine HCM labs ordered. HCM reviewed/discussed. Anticipatory guidance regarding healthy weight,  lifestyle and choices given. Colonoscopy at age 38.  - Pap IG, CT/NG w/ reflex HPV when ASC-U - WET PREP FOR TRICH, YEAST, CLUE  2. Screening for STD (sexually transmitted disease) - WET PREP FOR TRICH, YEAST, CLUE - HIV antibody - HCV Ab w/Rflx to Verification - RPR  3. Type 2 diabetes mellitus without complication, unspecified whether long term insulin use (HCC) At goal. Continue current regime. Advised eye exam. Pneumococcal vaccine at next visit, patient self pay - POCT glycosylated hemoglobin (Hb A1C) - POCT urinalysis dipstick - Microalbumin/Creatinine Ratio, Urine - Comprehensive metabolic panel - TSH - Lipid panel - CBC  Other orders - glipiZIDE (GLUCOTROL) 5 MG tablet; Take 1 tablet (5 mg total) by mouth daily before breakfast. - lisinopril (PRINIVIL,ZESTRIL) 10 MG tablet; Take 1 tablet (10 mg total) by mouth daily. - metFORMIN (GLUCOPHAGE) 1000 MG  tablet; Take 1 tablet (1,000 mg total) by mouth 2 (two) times daily with a meal.  Return in about 6 months (around 02/19/2018).    Myles Lipps, MD Primary Care at [facility] [address] [place], [place] [address] Ph.  [phone] Fax [phone]

## 2017-08-19 NOTE — Patient Instructions (Addendum)
1. Favor de sacar una cita con un oftalmologo para su chequeo rutinario de la retina 2. necesita vacuna de la pulmonia, la puede pedir en la farmacia o se la ponemos aca un su proxima cita    IF you received an x-ray today, you will receive an invoice from [facility] Radiology. Please contact [facility] Radiology at [phone] with questions or concerns regarding your invoice.   IF you received labwork today, you will receive an invoice from [facility]. Please contact [facility] at [phone] with questions or concerns regarding your invoice.   Our billing staff will not be able to assist you with questions regarding bills from these companies.  You will be contacted with the lab results as soon as they are available. The fastest way to get your results is to activate your My Chart account. Instructions are located on the last page of this paperwork. If you have not heard from [place] regarding the results in 2 weeks, please contact this office.    Cuidados preventivos en las mujeres de 29 a 13 aos de edad Preventive Care 40-64 Years, Female Los cuidados preventivos hacen referencia a las opciones en cuanto al estilo de vida y a las visitas al mdico, las cuales pueden promover la salud y Musician. Qu incluyen los cuidados preventivos?  Un examen fsico anual. Esto tambin se conoce como control de bienestar anual.  Exmenes dentales [place] al ao.  Exmenes de la vista de rutina. Pregntele al mdico con qu frecuencia debe realizarse un control de la vista.  Opciones personales de estilo de vida, que incluyen lo siguiente: ? [employer] y las encas a diario. ? Realizar actividad fsica con regularidad. ? Tener una dieta saludable. ? Evitar el consumo de tabaco y drogas. ? Limitar el consumo de bebidas alcohlicas. ? Counsellor. ? Tomar una dosis baja de aspirina diariamente a partir de los 65 aos de [place]. ? Tomar los suplementos de vitaminas o  minerales como se lo haya indicado el mdico. Qu sucede durante un control de bienestar anual? Los servicios y exmenes de deteccin realizados por su mdico durante el control de bienestar anual dependern de su salud general, factores de riesgo de estilo de vida y los antecedentes familiares de enfermedades. Asesoramiento Su mdico puede preguntarle acerca de:  Consumo de alcohol.  Consumo de tabaco.  Consumo de drogas.  Bienestar emocional.  Bienestar en el hogar y las relaciones personales.  Actividad sexual.  Hbitos de alimentacin.  Trabajo y [place] laboral.  Mtodos anticonceptivos.  El ciclo menstrual.  Antecedentes de embarazo.  Pruebas de deteccin Pueden hacerle las siguientes pruebas o mediciones:  Estatura, peso e ndice de masa muscular [facility]).  Presin arterial.  Niveles de lpidos y colesterol. Estos se pueden verificar cada 5 aos o, con ms frecuencia, si usted tiene ms de 37 aos de edad.  Control de la piel.  Pruebas de deteccin de cncer de pulmn. Es posible que se le realice esta prueba de deteccin a partir de los 56 aos de edad, si ha fumado durante 30 aos un paquete diario y sigue fumando o dej el hbito en algn momento en los ltimos 15 aos.  Prueba de Personnel officer en las heces [facility]). Es posible que se le realice esta prueba todos los aos a partir de los 9 aos de [place].  Sigmoidoscopa o colonoscopa flexible. Es posible que se le realice una sigmoidoscopa cada 5 aos o una colonoscopa cada 10 aos a Proofreader  de los 35 aos de edad.  Anlisis de sangre para la deteccin de la hepatitis C.  Anlisis de sangre para la deteccin de la hepatitis B.  Anlisis de enfermedades de transmisin sexual (ETS).  Pruebas de deteccin de la diabetes. Esto se Set designer un control del azcar en la sangre (glucosa) despus de no haber comido durante un periodo de tiempo (ayuno). Es posible que se le realice esta prueba cada 1 a 3 tres  aos.  Mamografa. Se puede realizar cada 1 o 2 aos. Hable con su mdico sobre cundo debe comenzar a Engineer, manufacturing de [place] regular. Esto depende de si tiene antecedentes familiares de cncer de mama o no.  Pruebas de deteccin de cncer relacionado con las mutaciones del BRCA. Es posible que se los deba realizar si tiene antecedentes de cncer de mama, de ovario, de trompas o peritoneal.  Examen plvico y prueba de Papanicolaou. Esto se puede realizar cada 22aos a Renato Gails de los 21aos de edad. A partir de los 30 aos, esto se puede Optometrist cada 5 aos si usted se realiza una prueba de Papanicolaou en combinacin con una prueba de deteccin del virus del papiloma humano (VPH).  Densitometra sea. Esto se realiza para detectar osteoporosis. Se le puede realizar este examen de deteccin si tiene un riesgo alto de tener osteoporosis.  Hable con su mdico para [employer], las opciones de tratamiento y, si corresponde, la necesidad de Optometrist ms pruebas. Vacunas El mdico puede recomendarle que se aplique algunas vacunas, por ejemplo:  Vacuna contra la gripe. Se recomienda aplicarse esta vacuna todos los aos.  Vacuna contra la difteria, ttanos y tos Dietitian (DTPa, DT). Es posible que tenga que aplicarse un refuerzo contra el ttanos y la difteria (DT) cada 10aos.  Vacuna contra la varicela. Es posible que tenga que aplicrsela si no recibi esta vacuna.  Vacuna contra el herpes zster. Es posible que la necesite despus de los 20 aos de edad.  Vacuna contra el sarampin, rubola y paperas (SRP). Es posible que necesite aplicarse al menos una dosis de la vacuna SRP si naci despus de [phone]. Podra tambin necesitar una segunda dosis.  Vacuna antineumoccica conjugada 13 valente (PCV13). Puede necesitar esta vacuna si tiene determinadas enfermedades y no se vacun anteriormente.  Vacuna antineumoccica de polisacridos (PPSV23). Quizs tenga que  aplicarse una o dos dosis si fuma o si sufre determinadas enfermedades.  Vacuna antimeningoccica. Puede necesitar esta vacuna si tiene determinadas afecciones.  Vacuna contra la hepatitis A. Es posible que necesite esta vacuna si tiene ciertas afecciones o si viaja o trabaja en lugares en los que podra estar expuesto a la hepatitis A.  Vacuna contra la hepatitis B. Es posible que necesite esta vacuna si tiene ciertas afecciones o si viaja o trabaja en lugares en los que podra estar expuesto a la hepatitis B.  Vacuna contra antihaemophilus influenzae tipoB (Hib). Puede necesitar esta vacuna si tiene determinadas afecciones.  Hable con el mdico sobre qu pruebas de deteccin y qu vacunas necesita, y con qu frecuencia las necesita. Esta informacin no tiene Marine scientist el consejo del mdico. Asegrese de hacerle al mdico cualquier pregunta que tenga. Document Released: 08/18/2016 Document Revised: 08/18/2016 Document Reviewed: 02/05/2015 Elsevier Interactive Patient Education  2018 [employer].

## 2017-08-20 ENCOUNTER — Encounter: Payer: Self-pay | Admitting: Family Medicine

## 2017-08-20 LAB — WET PREP FOR TRICH, YEAST, CLUE
Clue Cell Exam: NEGATIVE
Trichomonas Exam: NEGATIVE
Yeast Exam: NEGATIVE

## 2017-08-20 LAB — COMPREHENSIVE METABOLIC PANEL
ALT: 17 IU/L (ref 0–32)
AST: 16 IU/L (ref 0–40)
Albumin/Globulin Ratio: 1.3 (ref 1.2–2.2)
Albumin: 4 g/dL (ref 3.5–5.5)
Alkaline Phosphatase: 75 IU/L (ref 39–117)
BUN/Creatinine Ratio: 28 — ABNORMAL HIGH (ref 9–23)
BUN: 16 mg/dL (ref 6–24)
Bilirubin Total: 0.3 mg/dL (ref 0.0–1.2)
CO2: 23 mmol/L (ref 20–29)
Calcium: 9.4 mg/dL (ref 8.7–10.2)
Chloride: 102 mmol/L (ref 96–106)
Creatinine, Ser: 0.57 mg/dL (ref 0.57–1.00)
GFR calc Af Amer: 131 mL/min/{1.73_m2} (ref >59)
GFR calc non Af Amer: 114 mL/min/{1.73_m2} (ref >59)
Globulin, Total: 3.2 g/dL (ref 1.5–4.5)
Glucose: 234 mg/dL — ABNORMAL HIGH (ref 65–99)
Potassium: 4.4 mmol/L (ref 3.5–5.2)
Sodium: 137 mmol/L (ref 134–144)
Total Protein: 7.2 g/dL (ref 6.0–8.5)

## 2017-08-20 LAB — CBC
Hematocrit: 37.4 % (ref 34.0–46.6)
Hemoglobin: 11.9 g/dL (ref 11.1–15.9)
MCH: 29.6 pg (ref 26.6–33.0)
MCHC: 31.8 g/dL (ref 31.5–35.7)
MCV: 93 fL (ref 79–97)
Platelets: 272 10*3/uL (ref 150–379)
RBC: 4.02 x10E6/uL (ref 3.77–5.28)
RDW: 14.5 % (ref 12.3–15.4)
WBC: 5.9 10*3/uL (ref 3.4–10.8)

## 2017-08-20 LAB — HCV INTERPRETATION

## 2017-08-20 LAB — MICROALBUMIN / CREATININE URINE RATIO
Creatinine, Urine: 47.1 mg/dL
Microalb/Creat Ratio: 46.5 mg/g creat — ABNORMAL HIGH (ref 0.0–30.0)
Microalbumin, Urine: 21.9 ug/mL

## 2017-08-20 LAB — LIPID PANEL
Chol/HDL Ratio: 4.5 ratio — ABNORMAL HIGH (ref 0.0–4.4)
Cholesterol, Total: 213 mg/dL — ABNORMAL HIGH (ref 100–199)
HDL: 47 mg/dL (ref >39)
LDL Calculated: 144 mg/dL — ABNORMAL HIGH (ref 0–99)
Triglycerides: 110 mg/dL (ref 0–149)
VLDL Cholesterol Cal: 22 mg/dL (ref 5–40)

## 2017-08-20 LAB — HCV AB W/RFLX TO VERIFICATION: HCV Ab: <0.1 s/co ratio (ref 0.0–0.9)

## 2017-08-20 LAB — TSH: TSH: 1.97 u[IU]/mL (ref 0.450–4.500)

## 2017-08-20 LAB — RPR: RPR Ser Ql: NONREACTIVE

## 2017-08-20 LAB — HIV ANTIBODY (ROUTINE TESTING W REFLEX): HIV Screen 4th Generation wRfx: NONREACTIVE

## 2017-08-20 MED ORDER — ATORVASTATIN CALCIUM 40 MG PO TABS: 40.0000 mg | ORAL_TABLET | Freq: Every day | ORAL | 3 refills | Status: AC

## 2017-08-23 LAB — PAP IG, CT-NG, RFX HPV ASCU
Chlamydia, Nuc. Acid Amp: NEGATIVE
Gonococcus by Nucleic Acid Amp: NEGATIVE
PAP Smear Comment: 0

## 2017-10-08 ENCOUNTER — Encounter: Payer: Self-pay | Admitting: Family Medicine

## 2017-10-13 LAB — HM MAMMOGRAPHY

## 2017-11-04 ENCOUNTER — Encounter: Payer: Self-pay | Admitting: *Deleted

## 2017-11-11 ENCOUNTER — Encounter: Payer: Self-pay | Admitting: *Deleted

## 2018-02-22 ENCOUNTER — Ambulatory Visit: Payer: Self-pay | Admitting: Family Medicine
